# Patient Record
Sex: Female | Born: 1990 | Race: Black or African American | Hispanic: No | Marital: Married | Smoking: Never smoker
Health system: Southern US, Community
[De-identification: ages and names within clinical notes are randomized; demographics above are authoritative.]

## PROBLEM LIST (undated history)

## (undated) DIAGNOSIS — Z8619 Personal history of other infectious and parasitic diseases: Secondary | ICD-10-CM

## (undated) DIAGNOSIS — O24419 Gestational diabetes mellitus in pregnancy, unspecified control: Secondary | ICD-10-CM

## (undated) DIAGNOSIS — R87629 Unspecified abnormal cytological findings in specimens from vagina: Secondary | ICD-10-CM

## (undated) DIAGNOSIS — Z789 Other specified health status: Secondary | ICD-10-CM

## (undated) DIAGNOSIS — Z8759 Personal history of other complications of pregnancy, childbirth and the puerperium: Secondary | ICD-10-CM

## (undated) HISTORY — DX: Personal history of other infectious and parasitic diseases: Z86.19

## (undated) HISTORY — DX: Personal history of other complications of pregnancy, childbirth and the puerperium: Z87.59

## (undated) HISTORY — DX: Gestational diabetes mellitus in pregnancy, unspecified control: O24.419

## (undated) HISTORY — DX: Unspecified abnormal cytological findings in specimens from vagina: R87.629

---

## 2009-01-16 HISTORY — PX: VAGINA SURGERY: SHX829

## 2014-11-16 LAB — OB RESULTS CONSOLE GC/CHLAMYDIA
Chlamydia: NEGATIVE
Gonorrhea: NEGATIVE

## 2015-01-17 NOTE — L&D Delivery Note (Signed)
SVD of VMI at 2249 on 07/02/15.  EBL 250cc.  Placenta to pathology.  APGARs 8,9. Head delivered ROA with loose nuchal x 1 reduced.  Body delivered atraumatically.  Baby to abdomen.  Cord was clamped and cut.  Placenta delivered S/I/3VC.  Fundus was firmed with pitocin and massage.  Periclitoral laceration was repaired with 3-0 Rapide in the normal fashion.  Mom and baby stable.  Linda Hedges, DO

## 2015-01-19 LAB — OB RESULTS CONSOLE RPR
RPR: NONREACTIVE
RPR: NONREACTIVE

## 2015-01-19 LAB — OB RESULTS CONSOLE RUBELLA ANTIBODY, IGM: Rubella: NON-IMMUNE/NOT IMMUNE

## 2015-01-19 LAB — OB RESULTS CONSOLE ANTIBODY SCREEN: ANTIBODY SCREEN: NEGATIVE

## 2015-01-19 LAB — OB RESULTS CONSOLE ABO/RH: RH Type: POSITIVE

## 2015-01-19 LAB — OB RESULTS CONSOLE HIV ANTIBODY (ROUTINE TESTING): HIV: NONREACTIVE

## 2015-01-19 LAB — OB RESULTS CONSOLE HEPATITIS B SURFACE ANTIGEN: HEP B S AG: NEGATIVE

## 2015-02-15 ENCOUNTER — Other Ambulatory Visit (HOSPITAL_COMMUNITY): Payer: Self-pay | Admitting: Obstetrics & Gynecology

## 2015-02-15 DIAGNOSIS — R772 Abnormality of alphafetoprotein: Secondary | ICD-10-CM

## 2015-02-18 ENCOUNTER — Encounter (HOSPITAL_COMMUNITY): Payer: Self-pay

## 2015-02-18 ENCOUNTER — Other Ambulatory Visit (HOSPITAL_COMMUNITY): Payer: Self-pay | Admitting: Obstetrics & Gynecology

## 2015-02-18 ENCOUNTER — Ambulatory Visit (HOSPITAL_COMMUNITY)
Admission: RE | Admit: 2015-02-18 | Discharge: 2015-02-18 | Disposition: A | Discharge status: Home / Self Care | Payer: BLUE CROSS/BLUE SHIELD | Source: Ambulatory Visit | Attending: Obstetrics & Gynecology | Admitting: Obstetrics & Gynecology

## 2015-02-18 DIAGNOSIS — D582 Other hemoglobinopathies: Secondary | ICD-10-CM | POA: Insufficient documentation

## 2015-02-18 DIAGNOSIS — O281 Abnormal biochemical finding on antenatal screening of mother: Secondary | ICD-10-CM | POA: Diagnosis not present

## 2015-02-18 DIAGNOSIS — D259 Leiomyoma of uterus, unspecified: Secondary | ICD-10-CM

## 2015-02-18 DIAGNOSIS — R772 Abnormality of alphafetoprotein: Secondary | ICD-10-CM

## 2015-02-18 DIAGNOSIS — O280 Abnormal hematological finding on antenatal screening of mother: Secondary | ICD-10-CM | POA: Insufficient documentation

## 2015-02-18 DIAGNOSIS — O2412 Pre-existing diabetes mellitus, type 2, in childbirth: Secondary | ICD-10-CM | POA: Insufficient documentation

## 2015-02-18 DIAGNOSIS — Z3A20 20 weeks gestation of pregnancy: Secondary | ICD-10-CM | POA: Diagnosis not present

## 2015-02-18 DIAGNOSIS — Z862 Personal history of diseases of the blood and blood-forming organs and certain disorders involving the immune mechanism: Secondary | ICD-10-CM

## 2015-02-18 DIAGNOSIS — Z315 Encounter for genetic counseling: Secondary | ICD-10-CM | POA: Diagnosis not present

## 2015-02-18 DIAGNOSIS — Z3689 Encounter for other specified antenatal screening: Secondary | ICD-10-CM

## 2015-02-18 DIAGNOSIS — Z36 Encounter for antenatal screening of mother: Secondary | ICD-10-CM | POA: Diagnosis not present

## 2015-02-18 DIAGNOSIS — O3412 Maternal care for benign tumor of corpus uteri, second trimester: Secondary | ICD-10-CM

## 2015-02-18 NOTE — Progress Notes (Signed)
Genetic Counseling  High-Risk Gestation Note  Appointment Date:  02/18/2015 Referred By: Linda Hedges, DO Date of Birth:  10-03-1990 Partner:  Enid Cutter   Pregnancy History: G1P0000 Estimated Date of Delivery: 07/06/15 Estimated Gestational Age: [redacted]w[redacted]d Attending: Benjaman Lobe, MD    Teresa Myers and her partner, Mr. Damika Guthmiller, were seen for consultation for genetic counseling because of an elevated MSAFP of 5.24 MoMs based on maternal serum screening through [facility] [place] [facility] laboratory.   In Summary:   Elevated MSAFP 5.24 MoM  Discussed differential diagnoses including normal variation, ONTD, ventral wall defect, bleeding, kidney differences, amniotic fluid differences  Detailed ultrasound performed today and within normal limits  Declined amniocentesis  Discussed association with elevated AFP and placental problems; Follow-up ultrasound scheduled for 04/01/15 to assess fetal growth   Patient has hemoglobin C trait  Reviewed autosomal recessive inheritance and reduced carrier frequency of hemoglobin variants in individuals with European ancestry  Father of the pregnancy declined hemoglobin electrophoresis today  Couple expressed they are comfortable with newborn screening for hemoglobin variants  We reviewed Teresa Myers' maternal serum screening result, the elevation of MSAFP, and the associated > 1 in 5 risk for a fetal open neural tube defect.   We reviewed ONTDs, the typical multifactorial etiology, and variable prognosis.  In addition, we discussed additional explanations for an elevated MSAFP including: normal variation, twins, feto-maternal bleeding, a gestational dating error, abdominal wall defects, kidney differences, oligohydramnios, and placental problems.  We discussed that an unexplained elevation of MSAFP is associated with an increased risk for third trimester complications including: prematurity, low birth weight, and pre-eclampsia.    We reviewed additional  available screening and diagnostic options including detailed ultrasound and amniocentesis. Detailed ultrasound was performed today and was within normal limits. Complete ultrasound results reported separately. We discussed the risks, limitations, and benefits of ultrasound and amniocentesis.  After thoughtful consideration of these options, Teresa Myers declined amniocentesis.  She understands that ultrasound cannot rule out all birth defects or genetic syndromes.  However, she was counseled that 80-90% of fetuses with ONTDs can be detected by detailed 2nd trimester ultrasound, when well visualized.    The patient had routine screening through her OB office, which identified her to have hemoglobin C trait (Hb AC, or Hb C trait). Hemoglobin is the oxygen-carrying pigment of red blood cells.  The type of hemoglobin we have is determined by inheritance.  Most people typically inherit hemoglobin A.  Other variant types of hemoglobin include hemoglobin C or hemoglobin S.  If an individual inherits hemoglobin A from one parent and hemoglobin C from the other parent, that individual has hemoglobin C trait (noted as hemoglobin AC).  Likewise, an individual who inherits hemoglobin A from one parent and hemoglobin S from the other parent has hemoglobin S trait (sickle cell trait) (hemoglobin AS).  Hemoglobin C trait and sickle cell trait are not associated with any illness and there are generally no symptoms.  We reviewed genes and chromosomes. This couple was counseled that sickle cell anemia is inherited in an autosomal recessive manner, and occurs when both copies of the hemoglobin gene are changed and produce an abnormal hemoglobin. Typically, one abnormal gene for the production of hemoglobin S is inherited from each parent. Both the mother and the father of the baby must carry a variant hemoglobin such as hemoglobin S or C in order for a pregnancy to be at increased risk for a disease such as sickle cell  disease (hemoglobin SS)  or hemoglobin C disease (hemoglobin CC) in this pregnancy. Individuals with sickle cell disease (sickle cell anemia) have red blood cells that are not able to carry enough oxygen to all the tissues of the body, which may result in sickle cell crises, delays in growth and development, repeated infections, or hospitalization for blood transfusions and pain management. Since Ms. Jeneen Rinks does not carry hemoglobin S, this and future pregnancies are not expected to be at risk to inherit sickle cell disease (hemoglobin SS). Individuals with hemoglobin [place] disease are typically described to have milder features than individuals with hemoglobin SS. Hemoglobin C disease is generally a benign condition, although it can be associated with mild hemolytic anemia and/or splenomegaly (enlarged spleen).  If an enlarged spleen is painful, sometimes a splenectomy (surgical removal of the spleen) is performed.  There appear to be no other symptoms associated with this hemoglobin C disease.   Given the recessive inheritance, we discussed the importance of understanding the father of the pregnancy's carrier status in order to accurately predict the risk of a hemoglobinopathy in the fetus. He reported Northern European ancestry, no known family history of sickle cell anemia or hemoglobinopathies, and reported no known consanguinity to the patient.  We discussed that the carrier frequency for sickle cell trait is lower in the Caucasian population. When both parents are identified carriers, prenatal diagnosis via amniocentesis would be available. We also reviewed the availability of newborn screening in [place] for hemoglobinopathies.  The father of the pregnancy was offered the option of testing via hemoglobin electrophoresis to determine whether he has any hemoglobin variant, including sickle cell trait. He declined this testing today. They understand that an accurate risk assessment cannot be performed  without further information.   Both family histories were reviewed and found to be otherwise contributory for multiple relatives with vision loss for the patient. She reported that her maternal grandmother has blindness with onset estimated to be in her 52's. The patient had limited information regarding the age of onset. She reported that her mother and three maternal aunts have similar features with developing cataracts and having vision issues. Her mother is currently in her 30's. We discussed that cataracts and/or vision loss can be due to various causes and are often multifactorial. However, there are identified genetic causes for vision loss that can follow various forms of inheritance including autosomal dominant, autosomal recessive, and X-linked. The patient's reported family history is possibly suggestive of an autosomal dominant form of vision loss. We reviewed autosomal dominant inheritance and recurrence risk for offspring of affected relatives. We discussed that additional information regarding this family history is needed to accurately assess recurrence risk and possible implications for relatives. We discussed the importance of Ms. Jeneen Rinks informing her physician of this history so she can be followed and screened appropriately. Without further information regarding the provided family history, an accurate genetic risk cannot be calculated. Further genetic counseling is warranted if more information is obtained.  Ms. Sahra Bolan denied exposure to environmental toxins or chemical agents. She denied the use of alcohol, tobacco or street drugs. She denied significant viral illnesses during the course of her pregnancy. Her medical and surgical histories were noncontributory.   I counseled this couple for approximately 35 minutes regarding the above risks and available options.    Chipper Oman, MS,  Certified Genetic Counselor 02/18/2015

## 2015-02-26 ENCOUNTER — Encounter (HOSPITAL_COMMUNITY): Payer: Self-pay

## 2015-04-01 ENCOUNTER — Other Ambulatory Visit (HOSPITAL_COMMUNITY): Payer: Self-pay | Admitting: Maternal and Fetal Medicine

## 2015-04-01 ENCOUNTER — Ambulatory Visit (HOSPITAL_COMMUNITY)
Admission: RE | Admit: 2015-04-01 | Discharge: 2015-04-01 | Disposition: A | Discharge status: Home / Self Care | Payer: BLUE CROSS/BLUE SHIELD | Source: Ambulatory Visit | Attending: Obstetrics & Gynecology | Admitting: Obstetrics & Gynecology

## 2015-04-01 VITALS — BP 124/72 | HR 107 | Wt 132.2 lb

## 2015-04-01 DIAGNOSIS — Z3A26 26 weeks gestation of pregnancy: Secondary | ICD-10-CM | POA: Diagnosis not present

## 2015-04-01 DIAGNOSIS — O281 Abnormal biochemical finding on antenatal screening of mother: Secondary | ICD-10-CM

## 2015-04-01 DIAGNOSIS — O289 Unspecified abnormal findings on antenatal screening of mother: Secondary | ICD-10-CM | POA: Insufficient documentation

## 2015-04-01 DIAGNOSIS — O3412 Maternal care for benign tumor of corpus uteri, second trimester: Secondary | ICD-10-CM

## 2015-04-01 DIAGNOSIS — O280 Abnormal hematological finding on antenatal screening of mother: Secondary | ICD-10-CM

## 2015-04-01 DIAGNOSIS — D259 Leiomyoma of uterus, unspecified: Secondary | ICD-10-CM

## 2015-04-01 DIAGNOSIS — Z862 Personal history of diseases of the blood and blood-forming organs and certain disorders involving the immune mechanism: Secondary | ICD-10-CM

## 2015-04-01 DIAGNOSIS — D582 Other hemoglobinopathies: Secondary | ICD-10-CM

## 2015-05-28 ENCOUNTER — Ambulatory Visit (HOSPITAL_COMMUNITY): Admission: RE | Admit: 2015-05-28 | Payer: BLUE CROSS/BLUE SHIELD | Source: Ambulatory Visit

## 2015-06-04 LAB — OB RESULTS CONSOLE GBS: GBS: POSITIVE

## 2015-06-30 ENCOUNTER — Encounter (HOSPITAL_COMMUNITY): Payer: Self-pay

## 2015-06-30 ENCOUNTER — Inpatient Hospital Stay (HOSPITAL_COMMUNITY)
Admission: AD | Admit: 2015-06-30 | Discharge: 2015-07-04 | DRG: 775 | Disposition: A | Discharge status: Home / Self Care | Payer: Self-pay | Source: Ambulatory Visit | Attending: Obstetrics & Gynecology | Admitting: Obstetrics & Gynecology

## 2015-06-30 DIAGNOSIS — O139 Gestational [pregnancy-induced] hypertension without significant proteinuria, unspecified trimester: Secondary | ICD-10-CM | POA: Diagnosis present

## 2015-06-30 DIAGNOSIS — O99824 Streptococcus B carrier state complicating childbirth: Secondary | ICD-10-CM | POA: Diagnosis present

## 2015-06-30 DIAGNOSIS — O133 Gestational [pregnancy-induced] hypertension without significant proteinuria, third trimester: Secondary | ICD-10-CM

## 2015-06-30 DIAGNOSIS — O411230 Chorioamnionitis, third trimester, not applicable or unspecified: Secondary | ICD-10-CM | POA: Diagnosis present

## 2015-06-30 DIAGNOSIS — R1900 Intra-abdominal and pelvic swelling, mass and lump, unspecified site: Secondary | ICD-10-CM

## 2015-06-30 DIAGNOSIS — Z3A39 39 weeks gestation of pregnancy: Secondary | ICD-10-CM

## 2015-06-30 DIAGNOSIS — O134 Gestational [pregnancy-induced] hypertension without significant proteinuria, complicating childbirth: Principal | ICD-10-CM | POA: Diagnosis present

## 2015-06-30 HISTORY — DX: Other specified health status: Z78.9

## 2015-06-30 LAB — URINE MICROSCOPIC-ADD ON

## 2015-06-30 LAB — COMPREHENSIVE METABOLIC PANEL
ALT: 16 U/L (ref 14–54)
ALT: 15 U/L (ref 14–54)
ANION GAP: 9 (ref 5–15)
AST: 29 U/L (ref 15–41)
AST: 29 U/L (ref 15–41)
Albumin: 3 g/dL — ABNORMAL LOW (ref 3.5–5.0)
Albumin: 3 g/dL — ABNORMAL LOW (ref 3.5–5.0)
Alkaline Phosphatase: 231 U/L — ABNORMAL HIGH (ref 38–126)
Alkaline Phosphatase: 218 U/L — ABNORMAL HIGH (ref 38–126)
Anion gap: 7 (ref 5–15)
BILIRUBIN TOTAL: 0.5 mg/dL (ref 0.3–1.2)
BUN: 10 mg/dL (ref 6–20)
BUN: 9 mg/dL (ref 6–20)
CHLORIDE: 107 mmol/L (ref 101–111)
CO2: 20 mmol/L — ABNORMAL LOW (ref 22–32)
CO2: 21 mmol/L — ABNORMAL LOW (ref 22–32)
CREATININE: 0.75 mg/dL (ref 0.44–1.00)
Calcium: 10 mg/dL (ref 8.9–10.3)
Calcium: 9.7 mg/dL (ref 8.9–10.3)
Chloride: 106 mmol/L (ref 101–111)
Creatinine, Ser: 0.71 mg/dL (ref 0.44–1.00)
GFR calc Af Amer: >60 mL/min (ref >60)
GFR calc non Af Amer: >60 mL/min (ref >60)
GFR calc non Af Amer: >60 mL/min (ref >60)
Glucose, Bld: 89 mg/dL (ref 65–99)
Glucose, Bld: 91 mg/dL (ref 65–99)
Potassium: 3.9 mmol/L (ref 3.5–5.1)
Potassium: 3.6 mmol/L (ref 3.5–5.1)
SODIUM: 135 mmol/L (ref 135–145)
Sodium: 135 mmol/L (ref 135–145)
TOTAL PROTEIN: 7.1 g/dL (ref 6.5–8.1)
Total Bilirubin: 0.6 mg/dL (ref 0.3–1.2)
Total Protein: 6.9 g/dL (ref 6.5–8.1)

## 2015-06-30 LAB — URINALYSIS, ROUTINE W REFLEX MICROSCOPIC
Bilirubin Urine: NEGATIVE
Glucose, UA: NEGATIVE mg/dL
Ketones, ur: NEGATIVE mg/dL
Nitrite: NEGATIVE
PROTEIN: 100 mg/dL — AB
Specific Gravity, Urine: 1.025 (ref 1.005–1.030)
pH: 6 (ref 5.0–8.0)

## 2015-06-30 LAB — CBC
HCT: 30.6 % — ABNORMAL LOW (ref 36.0–46.0)
HEMATOCRIT: 31.9 % — AB (ref 36.0–46.0)
Hemoglobin: 11.2 g/dL — ABNORMAL LOW (ref 12.0–15.0)
Hemoglobin: 10.6 g/dL — ABNORMAL LOW (ref 12.0–15.0)
MCH: 29.2 pg (ref 26.0–34.0)
MCH: 28.8 pg (ref 26.0–34.0)
MCHC: 35.1 g/dL (ref 30.0–36.0)
MCHC: 34.6 g/dL (ref 30.0–36.0)
MCV: 83.1 fL (ref 78.0–100.0)
MCV: 83.2 fL (ref 78.0–100.0)
Platelets: 256 10*3/uL (ref 150–400)
Platelets: 247 10*3/uL (ref 150–400)
RBC: 3.84 MIL/uL — ABNORMAL LOW (ref 3.87–5.11)
RBC: 3.68 MIL/uL — ABNORMAL LOW (ref 3.87–5.11)
RDW: 14.5 % (ref 11.5–15.5)
RDW: 14.4 % (ref 11.5–15.5)
WBC: 7.4 10*3/uL (ref 4.0–10.5)
WBC: 8 10*3/uL (ref 4.0–10.5)

## 2015-06-30 LAB — TYPE AND SCREEN
ABO/RH(D): O POS
ANTIBODY SCREEN: NEGATIVE

## 2015-06-30 LAB — ABO/RH: ABO/RH(D): O POS

## 2015-06-30 MED ORDER — ONDANSETRON HCL 4 MG/2ML IJ SOLN: 4.0000 mg | Freq: Four times a day (QID) | INTRAMUSCULAR | Status: DC | PRN

## 2015-06-30 MED ORDER — HYDRALAZINE HCL 20 MG/ML IJ SOLN: 10.0000 mg | Freq: Once | INTRAMUSCULAR | Status: DC | PRN

## 2015-06-30 MED ORDER — OXYCODONE-ACETAMINOPHEN 5-325 MG PO TABS: 1.0000 | ORAL_TABLET | ORAL | Status: DC | PRN

## 2015-06-30 MED ORDER — LACTATED RINGERS IV SOLN: 500.0000 mL | INTRAVENOUS | Status: DC | PRN

## 2015-06-30 MED ORDER — OXYTOCIN 40 UNITS IN LACTATED RINGERS INFUSION - SIMPLE MED
2.5000 [IU]/h | INTRAVENOUS | Status: DC
  Filled 2015-06-30: qty 1000

## 2015-06-30 MED ORDER — ACETAMINOPHEN 325 MG PO TABS
650.0000 mg | ORAL_TABLET | ORAL | Status: DC | PRN
  Administered 2015-07-02: 650 mg via ORAL
  Filled 2015-06-30 (×2): qty 2

## 2015-06-30 MED ORDER — MISOPROSTOL 25 MCG QUARTER TABLET
25.0000 ug | ORAL_TABLET | ORAL | Status: DC | PRN
  Administered 2015-06-30 – 2015-07-01 (×4): 25 ug via VAGINAL
  Filled 2015-06-30 (×4): qty 0.25

## 2015-06-30 MED ORDER — PENICILLIN G POTASSIUM 5000000 UNITS IJ SOLR
2.5000 10*6.[IU] | INTRAMUSCULAR | Status: DC
  Administered 2015-06-30 – 2015-07-01 (×6): 2.5 10*6.[IU] via INTRAVENOUS
  Filled 2015-06-30 (×9): qty 2.5

## 2015-06-30 MED ORDER — PENICILLIN G POTASSIUM 5000000 UNITS IJ SOLR
5.0000 10*6.[IU] | Freq: Once | INTRAMUSCULAR | Status: AC
  Administered 2015-06-30: 5 10*6.[IU] via INTRAVENOUS
  Filled 2015-06-30: qty 5

## 2015-06-30 MED ORDER — LIDOCAINE HCL (PF) 1 % IJ SOLN
30.0000 mL | INTRAMUSCULAR | Status: DC | PRN
  Filled 2015-06-30: qty 30

## 2015-06-30 MED ORDER — ZOLPIDEM TARTRATE 5 MG PO TABS: 5.0000 mg | ORAL_TABLET | Freq: Every evening | ORAL | Status: DC | PRN

## 2015-06-30 MED ORDER — OXYTOCIN BOLUS FROM INFUSION: 500.0000 mL | INTRAVENOUS | Status: DC

## 2015-06-30 MED ORDER — FLEET ENEMA 7-19 GM/118ML RE ENEM
1.0000 | ENEMA | RECTAL | Status: DC | PRN
Start: 2015-06-30 — End: 2015-07-03

## 2015-06-30 MED ORDER — LACTATED RINGERS IV SOLN
INTRAVENOUS | Status: DC
  Administered 2015-06-30 – 2015-07-02 (×7): via INTRAVENOUS

## 2015-06-30 MED ORDER — BUTORPHANOL TARTRATE 1 MG/ML IJ SOLN
1.0000 mg | INTRAMUSCULAR | Status: DC | PRN
  Administered 2015-06-30: 1 mg via INTRAVENOUS
  Filled 2015-06-30: qty 1

## 2015-06-30 MED ORDER — DIPHENHYDRAMINE HCL 25 MG PO CAPS
25.0000 mg | ORAL_CAPSULE | Freq: Four times a day (QID) | ORAL | Status: DC | PRN
  Administered 2015-06-30: 25 mg via ORAL
  Filled 2015-06-30: qty 1

## 2015-06-30 MED ORDER — TERBUTALINE SULFATE 1 MG/ML IJ SOLN
0.2500 mg | Freq: Once | INTRAMUSCULAR | Status: DC | PRN
  Filled 2015-06-30: qty 1

## 2015-06-30 MED ORDER — LABETALOL HCL 5 MG/ML IV SOLN
20.0000 mg | INTRAVENOUS | Status: DC | PRN
Start: 2015-06-30 — End: 2015-07-03

## 2015-06-30 MED ORDER — NALBUPHINE HCL 10 MG/ML IJ SOLN
10.0000 mg | Freq: Once | INTRAMUSCULAR | Status: AC
  Administered 2015-06-30: 10 mg via INTRAVENOUS
  Filled 2015-06-30: qty 1

## 2015-06-30 MED ORDER — SOD CITRATE-CITRIC ACID 500-334 MG/5ML PO SOLN: 30.0000 mL | ORAL | Status: DC | PRN

## 2015-06-30 MED ORDER — OXYCODONE-ACETAMINOPHEN 5-325 MG PO TABS: 2.0000 | ORAL_TABLET | ORAL | Status: DC | PRN

## 2015-06-30 NOTE — H&P (Signed)
Teresa Myers is a 25 y.o. female presenting for IOL for gestational hypertension. No HA, vision change, epigastric pain. Maternal Medical History:  Fetal activity: Perceived fetal activity is normal.      OB History    Gravida Para Term Preterm AB TAB SAB Ectopic Multiple Living   1 0 0 0 0 0 0 0 0 0      Past Medical History  Diagnosis Date  . Medical history non-contributory    Past Surgical History  Procedure Laterality Date  . Vagina surgery  2011    laser surgery   Family History: family history is negative for Cancer, Diabetes, and Hypertension. Social History:  reports that she has never smoked. She has never used smokeless tobacco. She reports that she does not drink alcohol or use illicit drugs.   Prenatal Transfer Tool  Maternal Diabetes: No Genetic Screening: Normal Maternal Ultrasounds/Referrals: Normal Fetal Ultrasounds or other Referrals:  None Maternal Substance Abuse:  No Significant Maternal Medications:  None Significant Maternal Lab Results:  None Other Comments:  None  Review of Systems  Eyes: Negative for blurred vision.  Gastrointestinal: Negative for abdominal pain.  Neurological: Negative for headaches.    Dilation: Closed Effacement (%): Thick Exam by:: Lurena Nida, RN Blood pressure 139/97, pulse 97, temperature 98.5 F (36.9 C), temperature source Oral, resp. rate 16, height 5\' 4"  (1.626 m), weight 160 lb (72.576 kg), last menstrual period 09/29/2014, SpO2 100 %. Maternal Exam:  Uterine Assessment: Contraction strength is mild.  Contraction frequency is irregular.   Abdomen: Patient reports no abdominal tenderness. Fetal presentation: vertex     Fetal Exam Fetal State Assessment: Category I - tracings are normal.     Physical Exam  Cardiovascular: Normal rate and regular rhythm.   Respiratory: Effort normal and breath sounds normal.  GI: Soft. There is no tenderness.  Neurological: She has normal reflexes.    Prenatal  labs: ABO, Rh: --/--/O POS (06/14 1127) Antibody: NEG (06/14 1127) Rubella: Nonimmune (01/03 0000) RPR: Nonreactive, Nonreactive (01/03 0000)  HBsAg: Negative (01/03 0000)  HIV: Non-reactive (01/03 0000)  GBS: Positive (05/19 0000)   Results for orders placed or performed during the hospital encounter of 06/30/15 (from the past 24 hour(s))  Urinalysis, Routine w reflex microscopic (not at [facility])     Status: Abnormal   Collection Time: 06/30/15 11:20 AM  Result Value Ref Range   Color, Urine YELLOW YELLOW   APPearance HAZY (A) CLEAR   Specific Gravity, Urine 1.025 1.005 - 1.030   pH 6.0 5.0 - 8.0   Glucose, UA NEGATIVE NEGATIVE mg/dL   Hgb urine dipstick TRACE (A) NEGATIVE   Bilirubin Urine NEGATIVE NEGATIVE   Ketones, ur NEGATIVE NEGATIVE mg/dL   Protein, ur 100 (A) NEGATIVE mg/dL   Nitrite NEGATIVE NEGATIVE   Leukocytes, UA TRACE (A) NEGATIVE  Urine microscopic-add on     Status: Abnormal   Collection Time: 06/30/15 11:20 AM  Result Value Ref Range   Squamous Epithelial / LPF 6-30 (A) NONE SEEN   WBC, UA 6-30 0 - 5 WBC/hpf   RBC / HPF 0-5 0 - 5 RBC/hpf   Bacteria, UA MANY (A) NONE SEEN  CBC     Status: Abnormal   Collection Time: 06/30/15 11:27 AM  Result Value Ref Range   WBC 7.4 4.0 - 10.5 K/uL   RBC 3.84 (L) 3.87 - 5.11 MIL/uL   Hemoglobin 11.2 (L) 12.0 - 15.0 g/dL   HCT 31.9 (L) 36.0 - 46.0 %  MCV 83.1 78.0 - 100.0 fL   MCH 29.2 26.0 - 34.0 pg   MCHC 35.1 30.0 - 36.0 g/dL   RDW 14.5 11.5 - 15.5 %   Platelets 256 150 - 400 K/uL  Comprehensive metabolic panel     Status: Abnormal   Collection Time: 06/30/15 11:27 AM  Result Value Ref Range   Sodium 135 135 - 145 mmol/L   Potassium 3.9 3.5 - 5.1 mmol/L   Chloride 106 101 - 111 mmol/L   CO2 20 (L) 22 - 32 mmol/L   Glucose, Bld 89 65 - 99 mg/dL   BUN 10 6 - 20 mg/dL   Creatinine, Ser 0.75 0.44 - 1.00 mg/dL   Calcium 10.0 8.9 - 10.3 mg/dL   Total Protein 7.1 6.5 - 8.1 g/dL   Albumin 3.0 (L) 3.5 - 5.0 g/dL   AST  29 15 - 41 U/L   ALT 16 14 - 54 U/L   Alkaline Phosphatase 231 (H) 38 - 126 U/L   Total Bilirubin 0.5 0.3 - 1.2 mg/dL   GFR calc non Af Amer >60 >60 mL/min   GFR calc Af Amer >60 >60 mL/min   Anion gap 9 5 - 15  Type and screen [place]     Status: None   Collection Time: 06/30/15 11:27 AM  Result Value Ref Range   ABO/RH(D) O POS    Antibody Screen NEG    Sample Expiration 07/03/2015   CBC     Status: Abnormal   Collection Time: 06/30/15  4:59 PM  Result Value Ref Range   WBC 8.0 4.0 - 10.5 K/uL   RBC 3.68 (L) 3.87 - 5.11 MIL/uL   Hemoglobin 10.6 (L) 12.0 - 15.0 g/dL   HCT 30.6 (L) 36.0 - 46.0 %   MCV 83.2 78.0 - 100.0 fL   MCH 28.8 26.0 - 34.0 pg   MCHC 34.6 30.0 - 36.0 g/dL   RDW 14.4 11.5 - 15.5 %   Platelets 247 150 - 400 K/uL   Assessment/Plan: 25 yo G1P0 @ 60 1/7 weeks with gestational hypertension D/W patient and husband two stage induction of labor, risks reviewed All questions answered Patient states she understands and agrees    Adeeb Konecny II,Tarika Mckethan E 06/30/2015, 5:09 PM

## 2015-06-30 NOTE — Anesthesia Pain Management Evaluation Note (Signed)
  CRNA Pain Management Visit Note  Patient: Teresa Myers, 25 y.o., female  "Hello I am a member of the anesthesia team at [facility]. We have an anesthesia team available at all times to provide care throughout the hospital, including epidural management and anesthesia for C-section. I don't know your plan for the delivery whether it a natural birth, water birth, IV sedation, nitrous supplementation, doula or epidural, but we want to meet your pain goals."   1.Was your pain managed to your expectations on prior hospitalizations? Never hospitalized before this pregnancy.  No prior hospitalizations  2.What is your expectation for pain management during this hospitalization?     Epidural, IV pain meds and Nitrous Oxide  3.How can we help you reach that goal? By explaining my options for pain control.  Record the patient's initial score and the patient's pain goal.   Pain: 0  Pain Goal: 7 The [facility] wants you to be able to say your pain was always managed very well.  Jabier Mutton 06/30/2015

## 2015-06-30 NOTE — MAU Note (Signed)
Pt sent over from Dr Tomblin's office due to increased blood pressure noted by MD at routine office visit today. Pt for pre-eclampsia evaluation. Pt denies bleeding and leaking of fluid. Pt states baby is moving normally.

## 2015-06-30 NOTE — MAU Provider Note (Signed)
History     CSN: [id number]  Arrival date and time: 06/30/15 1115   First Provider Initiated Contact with Patient 06/30/15 1222        Chief Complaint  Patient presents with  . Hypertension   HPI Teresa Myers is a 25 y.o. G1P0000 at [redacted]w[redacted]d who was sent from office for BP evaluation. Pt seen in office today for ROV & found to have elevated BP & proteinuria. Patient denies history of high blood pressure. Denies headache, vision changes, epigastric pain, chest pain, SOB. Denies OB complaints. Positive fetal movement.   OB History    Gravida Para Term Preterm AB TAB SAB Ectopic Multiple Living   1 0 0 0 0 0 0 0 0 0       Past Medical History  Diagnosis Date  . Medical history non-contributory     Past Surgical History  Procedure Laterality Date  . Vagina surgery  2011    laser surgery    Family History  Problem Relation Age of Onset  . Cancer Neg Hx   . Diabetes Neg Hx   . Hypertension Neg Hx     Social History  Substance Use Topics  . Smoking status: Never Smoker   . Smokeless tobacco: Never Used  . Alcohol Use: No    Allergies:  Allergies  Allergen Reactions  . Other Dermatitis    Dairy causes eczema    Prescriptions prior to admission  Medication Sig Dispense Refill Last Dose  . Prenatal Vit-Fe Fumarate-FA (PRENATAL MULTIVITAMIN) TABS tablet Take 1 tablet by mouth at bedtime.    06/29/2015 at Unknown time    Review of Systems  Constitutional: Negative.   Eyes: Negative for blurred vision and double vision.  Respiratory: Negative.   Cardiovascular: Negative.   Gastrointestinal: Negative.   Genitourinary: Negative.   Neurological: Negative for headaches.   Physical Exam   Blood pressure 139/96, pulse 98, temperature 98.5 F (36.9 C), temperature source Oral, resp. rate 17, height 5\' 4"  (1.626 m), weight 160 lb (72.576 kg), last menstrual period 09/29/2014, SpO2 100 %.  Patient Vitals for the past 24 hrs:  BP Temp Temp src Pulse Resp SpO2 Height  Weight  06/30/15 1214 141/96 mmHg - - 90 - - - -  06/30/15 1204 139/96 mmHg - - 98 - - - -  06/30/15 1200 141/96 mmHg - - 89 - - - -  06/30/15 1159 (!) 147/125 mmHg - - 96 - - - -  06/30/15 1144 144/98 mmHg - - 92 - - - -  06/30/15 1140 139/98 mmHg 98.5 F (36.9 C) Oral 91 17 - 5\' 4"  (1.626 m) 160 lb (72.576 kg)  06/30/15 1138 153/99 mmHg - - 90 - - - -  06/30/15 1137 - - - 93 - 100 % - -    Physical Exam  Nursing note and vitals reviewed. Constitutional: She is oriented to person, place, and time. She appears well-developed and well-nourished. No distress.  HENT:  Head: Normocephalic and atraumatic.  Eyes: Conjunctivae are normal. Right eye exhibits no discharge. Left eye exhibits no discharge. No scleral icterus.  Neck: Normal range of motion.  Cardiovascular: Normal rate, regular rhythm and normal heart sounds.   No murmur heard. Respiratory: Effort normal and breath sounds normal. No respiratory distress. She has no wheezes.  GI: Soft. There is no tenderness.  Musculoskeletal: She exhibits no edema.  Neurological: She is alert and oriented to person, place, and time. She has normal reflexes.  No clonus  Skin: Skin is warm and dry. She is not diaphoretic.  Psychiatric: She has a normal mood and affect. Her behavior is normal. Judgment and thought content normal.   Fetal Tracing:  Baseline: 140 Variability: moderate Accelerations: 15x15 Decelerations: none  Toco: 2-6    MAU Course  Procedures Results for orders placed or performed during the hospital encounter of 06/30/15 (from the past 24 hour(s))  Urinalysis, Routine w reflex microscopic (not at [facility])     Status: Abnormal   Collection Time: 06/30/15 11:20 AM  Result Value Ref Range   Color, Urine YELLOW YELLOW   APPearance HAZY (A) CLEAR   Specific Gravity, Urine 1.025 1.005 - 1.030   pH 6.0 5.0 - 8.0   Glucose, UA NEGATIVE NEGATIVE mg/dL   Hgb urine dipstick TRACE (A) NEGATIVE   Bilirubin Urine NEGATIVE  NEGATIVE   Ketones, ur NEGATIVE NEGATIVE mg/dL   Protein, ur 100 (A) NEGATIVE mg/dL   Nitrite NEGATIVE NEGATIVE   Leukocytes, UA TRACE (A) NEGATIVE  Urine microscopic-add on     Status: Abnormal   Collection Time: 06/30/15 11:20 AM  Result Value Ref Range   Squamous Epithelial / LPF 6-30 (A) NONE SEEN   WBC, UA 6-30 0 - 5 WBC/hpf   RBC / HPF 0-5 0 - 5 RBC/hpf   Bacteria, UA MANY (A) NONE SEEN  CBC     Status: Abnormal   Collection Time: 06/30/15 11:27 AM  Result Value Ref Range   WBC 7.4 4.0 - 10.5 K/uL   RBC 3.84 (L) 3.87 - 5.11 MIL/uL   Hemoglobin 11.2 (L) 12.0 - 15.0 g/dL   HCT 31.9 (L) 36.0 - 46.0 %   MCV 83.1 78.0 - 100.0 fL   MCH 29.2 26.0 - 34.0 pg   MCHC 35.1 30.0 - 36.0 g/dL   RDW 14.5 11.5 - 15.5 %   Platelets 256 150 - 400 K/uL  Comprehensive metabolic panel     Status: Abnormal   Collection Time: 06/30/15 11:27 AM  Result Value Ref Range   Sodium 135 135 - 145 mmol/L   Potassium 3.9 3.5 - 5.1 mmol/L   Chloride 106 101 - 111 mmol/L   CO2 20 (L) 22 - 32 mmol/L   Glucose, Bld 89 65 - 99 mg/dL   BUN 10 6 - 20 mg/dL   Creatinine, Ser 0.75 0.44 - 1.00 mg/dL   Calcium 10.0 8.9 - 10.3 mg/dL   Total Protein 7.1 6.5 - 8.1 g/dL   Albumin 3.0 (L) 3.5 - 5.0 g/dL   AST 29 15 - 41 U/L   ALT 16 14 - 54 U/L   Alkaline Phosphatase 231 (H) 38 - 126 U/L   Total Bilirubin 0.5 0.3 - 1.2 mg/dL   GFR calc non Af Amer >60 >60 mL/min   GFR calc Af Amer >60 >60 mL/min   Anion gap 9 5 - 15    MDM No severe range BPs (one BP registered with diastolic 0000000 was not accurate) Reactive tracing S/w Dr. Gaetano Net who will admit patient for gestational hypertension  Assessment and Plan  A: 1. Gestational hypertension, third trimester      Jorje Guild 06/30/2015, 12:21 PM

## 2015-07-01 LAB — RPR: RPR Ser Ql: NONREACTIVE

## 2015-07-01 MED ORDER — PENICILLIN G POTASSIUM 5000000 UNITS IJ SOLR
2.5000 10*6.[IU] | INTRAVENOUS | Status: DC
  Administered 2015-07-02 (×4): 2.5 10*6.[IU] via INTRAVENOUS
  Filled 2015-07-01 (×9): qty 2.5

## 2015-07-01 MED ORDER — NALBUPHINE HCL 10 MG/ML IJ SOLN
10.0000 mg | INTRAMUSCULAR | Status: DC | PRN
  Administered 2015-07-01 – 2015-07-02 (×6): 10 mg via INTRAVENOUS
  Filled 2015-07-01 (×6): qty 1

## 2015-07-01 MED ORDER — MISOPROSTOL 25 MCG QUARTER TABLET
25.0000 ug | ORAL_TABLET | ORAL | Status: DC
  Administered 2015-07-01 – 2015-07-02 (×2): 25 ug via VAGINAL
  Filled 2015-07-01 (×2): qty 1
  Filled 2015-07-01: qty 0.25
  Filled 2015-07-01: qty 1
  Filled 2015-07-01: qty 0.25

## 2015-07-01 MED ORDER — TERBUTALINE SULFATE 1 MG/ML IJ SOLN
0.2500 mg | Freq: Once | INTRAMUSCULAR | Status: DC | PRN
  Filled 2015-07-01: qty 1

## 2015-07-01 MED ORDER — OXYTOCIN 40 UNITS IN LACTATED RINGERS INFUSION - SIMPLE MED
1.0000 m[IU]/min | INTRAVENOUS | Status: DC
  Administered 2015-07-01: 2 m[IU]/min via INTRAVENOUS
  Filled 2015-07-01: qty 1000

## 2015-07-01 NOTE — Progress Notes (Signed)
Patient ID: Teresa Myers, female   DOB: [birth year], 25 y.o.   MRN: [id number] [name] denies any HA, scotomata or ruq pain Exhausted by long day of ctxs and minimal change Wants a break,before restarting IOL  VSSAF  BPs 125/68-157/95 FHR 140s Cat 1  Ctxs from q 2' to 5  Cx 0.5/posterior/thick  DTRs 1/4   Gestational HTN - stable Pt wants shower, dinner, and break before restarting Will d/c Pit, and restart with cytotec tonight.  Restart abx and pit in am and hopefully will get enough change for AROM

## 2015-07-01 NOTE — Progress Notes (Signed)
Patient ID: Teresa Myers, female   DOB: 09-09-1990, 25 y.o.   MRN: [id number] Pt uncomfortable with ctxs Better with pain meds No CNS sxs  VS BP 101/64 FHR 140s cat 1 ctxs irreg  Cx Closed/25/Posterior and firm  Gest HTN IOL - stable Unable to arom.  Will continue with cytotec then try pit AROM when able GBS + on IV Abx

## 2015-07-02 ENCOUNTER — Inpatient Hospital Stay (HOSPITAL_COMMUNITY): Payer: Self-pay | Admitting: Anesthesiology

## 2015-07-02 ENCOUNTER — Encounter (HOSPITAL_COMMUNITY): Payer: Self-pay | Admitting: Anesthesiology

## 2015-07-02 LAB — CBC
HCT: 29.4 % — ABNORMAL LOW (ref 36.0–46.0)
Hemoglobin: 10.2 g/dL — ABNORMAL LOW (ref 12.0–15.0)
MCH: 28.8 pg (ref 26.0–34.0)
MCHC: 34.7 g/dL (ref 30.0–36.0)
MCV: 83.1 fL (ref 78.0–100.0)
Platelets: 241 10*3/uL (ref 150–400)
RBC: 3.54 MIL/uL — ABNORMAL LOW (ref 3.87–5.11)
RDW: 14.5 % (ref 11.5–15.5)
WBC: 10.6 10*3/uL — AB (ref 4.0–10.5)

## 2015-07-02 MED ORDER — TERBUTALINE SULFATE 1 MG/ML IJ SOLN
0.2500 mg | Freq: Once | INTRAMUSCULAR | Status: DC | PRN
  Filled 2015-07-02: qty 1

## 2015-07-02 MED ORDER — PHENYLEPHRINE 40 MCG/ML (10ML) SYRINGE FOR IV PUSH (FOR BLOOD PRESSURE SUPPORT)
80.0000 ug | PREFILLED_SYRINGE | INTRAVENOUS | Status: DC | PRN
  Filled 2015-07-02: qty 10
  Filled 2015-07-02: qty 5

## 2015-07-02 MED ORDER — FENTANYL 2.5 MCG/ML BUPIVACAINE 1/10 % EPIDURAL INFUSION (WH - ANES)
14.0000 mL/h | INTRAMUSCULAR | Status: DC | PRN
  Administered 2015-07-02 (×2): 14 mL/h via EPIDURAL
  Filled 2015-07-02 (×2): qty 125

## 2015-07-02 MED ORDER — DEXTROSE 5 % IV SOLN
2.0000 mg/kg | Freq: Once | INTRAVENOUS | Status: AC
  Administered 2015-07-02: 150 mg via INTRAVENOUS
  Filled 2015-07-02: qty 3.75

## 2015-07-02 MED ORDER — SODIUM CHLORIDE 0.9 % IV SOLN
2.0000 g | Freq: Once | INTRAVENOUS | Status: AC
  Administered 2015-07-02: 2 g via INTRAVENOUS
  Filled 2015-07-02: qty 2000

## 2015-07-02 MED ORDER — OXYTOCIN 40 UNITS IN LACTATED RINGERS INFUSION - SIMPLE MED
1.0000 m[IU]/min | INTRAVENOUS | Status: DC
  Administered 2015-07-02: 2 m[IU]/min via INTRAVENOUS

## 2015-07-02 MED ORDER — PHENYLEPHRINE 40 MCG/ML (10ML) SYRINGE FOR IV PUSH (FOR BLOOD PRESSURE SUPPORT)
80.0000 ug | PREFILLED_SYRINGE | INTRAVENOUS | Status: DC | PRN
  Filled 2015-07-02: qty 5

## 2015-07-02 MED ORDER — DIPHENHYDRAMINE HCL 50 MG/ML IJ SOLN: 12.5000 mg | INTRAMUSCULAR | Status: DC | PRN

## 2015-07-02 MED ORDER — LACTATED RINGERS IV SOLN: 500.0000 mL | Freq: Once | INTRAVENOUS | Status: DC

## 2015-07-02 MED ORDER — EPHEDRINE 5 MG/ML INJ
10.0000 mg | INTRAVENOUS | Status: DC | PRN
  Filled 2015-07-02: qty 2

## 2015-07-02 MED ORDER — LIDOCAINE HCL (PF) 1 % IJ SOLN
INTRAMUSCULAR | Status: DC | PRN
  Administered 2015-07-02 (×2): 6 mL

## 2015-07-02 MED ORDER — LACTATED RINGERS IV SOLN
500.0000 mL | Freq: Once | INTRAVENOUS | Status: AC
  Administered 2015-07-02: 500 mL via INTRAVENOUS

## 2015-07-02 NOTE — Anesthesia Preprocedure Evaluation (Signed)
Anesthesia Evaluation  Patient identified by MRN, date of birth, ID band Patient awake    Reviewed: Allergy & Precautions, NPO status , Patient's Chart, lab work & pertinent test results  Airway Mallampati: II  TM Distance: >3 FB Neck ROM: Full    Dental no notable dental hx.    Pulmonary neg pulmonary ROS,    Pulmonary exam normal breath sounds clear to auscultation       Cardiovascular hypertension, Normal cardiovascular exam Rhythm:Regular Rate:Normal     Neuro/Psych negative neurological ROS  negative psych ROS   GI/Hepatic negative GI ROS, Neg liver ROS,   Endo/Other  negative endocrine ROS  Renal/GU negative Renal ROS  negative genitourinary   Musculoskeletal negative musculoskeletal ROS (+)   Abdominal   Peds negative pediatric ROS (+)  Hematology negative hematology ROS (+)   Anesthesia Other Findings   Reproductive/Obstetrics negative OB ROS                             Anesthesia Physical Anesthesia Plan  ASA: II  Anesthesia Plan: Epidural   Post-op Pain Management:    Induction: Intravenous  Airway Management Planned: Natural Airway  Additional Equipment:   Intra-op Plan:   Post-operative Plan:   Informed Consent: I have reviewed the patients History and Physical, chart, labs and discussed the procedure including the risks, benefits and alternatives for the proposed anesthesia with the patient or authorized representative who has indicated his/her understanding and acceptance.   Dental advisory given  Plan Discussed with: CRNA  Anesthesia Plan Comments: (Informed consent obtained prior to proceeding including risk of failure, 1% risk of PDPH, risk of minor discomfort and bruising.  Discussed rare but serious complications including epidural abscess, permanent nerve injury, epidural hematoma.  Discussed alternatives to epidural analgesia and patient desires to  proceed.  Timeout performed pre-procedure verifying patient name, procedure, and platelet count.  Patient tolerated procedure well. )        Anesthesia Quick Evaluation

## 2015-07-02 NOTE — Anesthesia Procedure Notes (Signed)
Epidural Patient location during procedure: OB  Staffing Anesthesiologist: Franne Grip Performed by: anesthesiologist   Preanesthetic Checklist Completed: patient identified, site marked, surgical consent, pre-op evaluation, timeout performed, IV checked, risks and benefits discussed and monitors and equipment checked  Epidural Patient position: sitting Prep: Betadine Patient monitoring: heart rate and blood pressure Approach: midline Location: L3-L4 Injection technique: LOR saline  Needle:  Needle type: Tuohy  Needle gauge: 17 G Needle length: 9 cm and 9 Needle insertion depth: 6 cm Catheter type: closed end flexible Catheter size: 19 Gauge Catheter at skin depth: 11 cm Test dose: negative and Other  Assessment Events: blood not aspirated, injection not painful, no injection resistance, negative IV test and no paresthesia  Additional Notes   Patient tolerated the insertion well without complications.Reason for block:procedure for pain

## 2015-07-02 NOTE — Progress Notes (Signed)
Teresa Myers is a 25 y.o. G1P0000 at [redacted]w[redacted]d by ultrasound admitted for induction of labor due to Hypertension.  Subjective: Feeling CTX with more intensity; last VMP at 0240.  Stadol helping with pain.  Objective: BP 154/99 mmHg  Pulse 95  Temp(Src) 99.2 F (37.3 C) (Oral)  Resp 20  Ht 5\' 4"  (1.626 m)  Wt 160 lb (72.576 kg)  BMI 27.45 kg/m2  SpO2 100%  LMP 09/29/2014      FHT:  FHR: 140 bpm, variability: moderate,  accelerations:  Present,  decelerations:  Absent UC:   irregular, every 3-5 minutes SVE:   Dilation: 1.5 Effacement (%): 70, 80 Station: -2 Exam by:: dr Lynnette Caffey  AROM, clear  Labs: Lab Results  Component Value Date   WBC 8.0 06/30/2015   HGB 10.6* 06/30/2015   HCT 30.6* 06/30/2015   MCV 83.2 06/30/2015   PLT 247 06/30/2015    Assessment / Plan: Induction of labor due to gestational hypertension,  progressing well on pitocin  Labor: Progressing normally Preeclampsia:  labs stable Fetal Wellbeing:  Category I Pain Control:  IV pain meds I/D:  n/a Anticipated MOD:  NSVD  Altha Sweitzer 07/02/2015, 8:29 AM

## 2015-07-03 ENCOUNTER — Encounter (HOSPITAL_COMMUNITY): Payer: Self-pay

## 2015-07-03 LAB — CBC
HCT: 25.9 % — ABNORMAL LOW (ref 36.0–46.0)
HCT: 27.6 % — ABNORMAL LOW (ref 36.0–46.0)
Hemoglobin: 8.9 g/dL — ABNORMAL LOW (ref 12.0–15.0)
Hemoglobin: 9.6 g/dL — ABNORMAL LOW (ref 12.0–15.0)
MCH: 28.2 pg (ref 26.0–34.0)
MCH: 28.6 pg (ref 26.0–34.0)
MCHC: 34.4 g/dL (ref 30.0–36.0)
MCHC: 34.8 g/dL (ref 30.0–36.0)
MCV: 82 fL (ref 78.0–100.0)
MCV: 82.1 fL (ref 78.0–100.0)
PLATELETS: 208 10*3/uL (ref 150–400)
Platelets: 235 10*3/uL (ref 150–400)
RBC: 3.16 MIL/uL — ABNORMAL LOW (ref 3.87–5.11)
RBC: 3.36 MIL/uL — ABNORMAL LOW (ref 3.87–5.11)
RDW: 14.5 % (ref 11.5–15.5)
RDW: 14.6 % (ref 11.5–15.5)
WBC: 13.7 10*3/uL — AB (ref 4.0–10.5)
WBC: 14.9 10*3/uL — AB (ref 4.0–10.5)

## 2015-07-03 LAB — COMPREHENSIVE METABOLIC PANEL
ALT: 13 U/L — ABNORMAL LOW (ref 14–54)
AST: 33 U/L (ref 15–41)
Albumin: 2.2 g/dL — ABNORMAL LOW (ref 3.5–5.0)
Alkaline Phosphatase: 171 U/L — ABNORMAL HIGH (ref 38–126)
Anion gap: 5 (ref 5–15)
BUN: 14 mg/dL (ref 6–20)
CO2: 22 mmol/L (ref 22–32)
Calcium: 8.4 mg/dL — ABNORMAL LOW (ref 8.9–10.3)
Chloride: 108 mmol/L (ref 101–111)
Creatinine, Ser: 1.14 mg/dL — ABNORMAL HIGH (ref 0.44–1.00)
GFR calc Af Amer: >60 mL/min (ref >60)
GFR calc non Af Amer: >60 mL/min (ref >60)
Glucose, Bld: 78 mg/dL (ref 65–99)
Potassium: 3.7 mmol/L (ref 3.5–5.1)
Sodium: 135 mmol/L (ref 135–145)
Total Bilirubin: 0.7 mg/dL (ref 0.3–1.2)
Total Protein: 5.3 g/dL — ABNORMAL LOW (ref 6.5–8.1)

## 2015-07-03 LAB — CREATININE, SERUM
Creatinine, Ser: 1.06 mg/dL — ABNORMAL HIGH (ref 0.44–1.00)
GFR calc Af Amer: >60 mL/min (ref >60)
GFR calc non Af Amer: >60 mL/min (ref >60)

## 2015-07-03 MED ORDER — ACETAMINOPHEN 325 MG PO TABS: 650.0000 mg | ORAL_TABLET | ORAL | Status: DC | PRN

## 2015-07-03 MED ORDER — TETANUS-DIPHTH-ACELL PERTUSSIS 5-2.5-18.5 LF-MCG/0.5 IM SUSP: 0.5000 mL | Freq: Once | INTRAMUSCULAR | Status: DC

## 2015-07-03 MED ORDER — ZOLPIDEM TARTRATE 5 MG PO TABS
5.0000 mg | ORAL_TABLET | Freq: Every evening | ORAL | Status: DC | PRN
Start: 2015-07-03 — End: 2015-07-04

## 2015-07-03 MED ORDER — DIBUCAINE 1 % RE OINT: 1.0000 "application " | TOPICAL_OINTMENT | RECTAL | Status: DC | PRN

## 2015-07-03 MED ORDER — PRENATAL MULTIVITAMIN CH
1.0000 | ORAL_TABLET | Freq: Every day | ORAL | Status: DC
  Administered 2015-07-03 – 2015-07-04 (×2): 1 via ORAL
  Filled 2015-07-03 (×2): qty 1

## 2015-07-03 MED ORDER — OXYCODONE-ACETAMINOPHEN 5-325 MG PO TABS: 2.0000 | ORAL_TABLET | ORAL | Status: DC | PRN

## 2015-07-03 MED ORDER — DIPHENHYDRAMINE HCL 25 MG PO CAPS: 25.0000 mg | ORAL_CAPSULE | Freq: Four times a day (QID) | ORAL | Status: DC | PRN

## 2015-07-03 MED ORDER — SENNOSIDES-DOCUSATE SODIUM 8.6-50 MG PO TABS
2.0000 | ORAL_TABLET | ORAL | Status: DC
  Administered 2015-07-04: 2 via ORAL
  Filled 2015-07-03: qty 2

## 2015-07-03 MED ORDER — ONDANSETRON HCL 4 MG/2ML IJ SOLN: 4.0000 mg | INTRAMUSCULAR | Status: DC | PRN

## 2015-07-03 MED ORDER — BENZOCAINE-MENTHOL 20-0.5 % EX AERO
1.0000 "application " | INHALATION_SPRAY | CUTANEOUS | Status: DC | PRN
  Administered 2015-07-03: 1 via TOPICAL
  Filled 2015-07-03: qty 56

## 2015-07-03 MED ORDER — NIFEDIPINE ER OSMOTIC RELEASE 30 MG PO TB24
30.0000 mg | ORAL_TABLET | Freq: Every day | ORAL | Status: DC
  Administered 2015-07-03 – 2015-07-04 (×2): 30 mg via ORAL
  Filled 2015-07-03 (×2): qty 1

## 2015-07-03 MED ORDER — WITCH HAZEL-GLYCERIN EX PADS: 1.0000 "application " | MEDICATED_PAD | CUTANEOUS | Status: DC | PRN

## 2015-07-03 MED ORDER — GENTAMICIN SULFATE 40 MG/ML IJ SOLN
2.0000 mg/kg | Freq: Once | INTRAVENOUS | Status: AC
  Administered 2015-07-03: 150 mg via INTRAVENOUS
  Filled 2015-07-03: qty 3.75

## 2015-07-03 MED ORDER — OXYCODONE-ACETAMINOPHEN 5-325 MG PO TABS
1.0000 | ORAL_TABLET | ORAL | Status: DC | PRN
  Administered 2015-07-03: 1 via ORAL
  Filled 2015-07-03 (×2): qty 1

## 2015-07-03 MED ORDER — SIMETHICONE 80 MG PO CHEW: 80.0000 mg | CHEWABLE_TABLET | ORAL | Status: DC | PRN

## 2015-07-03 MED ORDER — ONDANSETRON HCL 4 MG PO TABS: 4.0000 mg | ORAL_TABLET | ORAL | Status: DC | PRN

## 2015-07-03 MED ORDER — SODIUM CHLORIDE 0.9 % IV SOLN
2.0000 g | Freq: Once | INTRAVENOUS | Status: AC
  Administered 2015-07-03: 2 g via INTRAVENOUS
  Filled 2015-07-03: qty 2000

## 2015-07-03 MED ORDER — IBUPROFEN 600 MG PO TABS
600.0000 mg | ORAL_TABLET | Freq: Four times a day (QID) | ORAL | Status: DC
  Administered 2015-07-03 – 2015-07-04 (×6): 600 mg via ORAL
  Filled 2015-07-03 (×6): qty 1

## 2015-07-03 MED ORDER — COCONUT OIL OIL
1.0000 "application " | TOPICAL_OIL | Status: DC | PRN
  Administered 2015-07-03: 1 via TOPICAL
  Filled 2015-07-03: qty 120

## 2015-07-03 NOTE — Anesthesia Postprocedure Evaluation (Signed)
Anesthesia Post Note  Patient: Teresa Myers  Procedure(s) Performed: * No procedures listed *  Patient location during evaluation: Mother Baby Anesthesia Type: Epidural Level of consciousness: awake Pain management: satisfactory to patient Vital Signs Assessment: post-procedure vital signs reviewed and stable Respiratory status: spontaneous breathing Cardiovascular status: stable Anesthetic complications: no     Last Vitals:  Filed Vitals:   07/03/15 0708 07/03/15 0710  BP: 150/92 150/88  Pulse:    Temp:    Resp:      Last Pain:  Filed Vitals:   07/03/15 0711  PainSc: 6    Pain Goal: Patients Stated Pain Goal: 4 (07/02/15 0230)               Casimer Lanius

## 2015-07-03 NOTE — Progress Notes (Addendum)
Post Partum Day 1 Subjective: no complaints, up ad lib, voiding and tolerating PO.  No HA, CP/SOB, RUQ pain, or visual disturbance.  Objective: Blood pressure 150/88, pulse 94, temperature 99.4 F (37.4 C), temperature source Oral, resp. rate 18, height 5\' 4"  (1.626 m), weight 160 lb (72.576 kg), last menstrual period 09/29/2014, SpO2 100 %, unknown if currently breastfeeding.  Physical Exam:  General: alert, cooperative and appears stated age Lochia: appropriate Uterine Fundus: firm Incision: n/a DVT Evaluation: No evidence of DVT seen on physical exam. Negative Homan's sign. No cords or calf tenderness. DTRs 2+   Recent Labs  07/03/15 0041 07/03/15 0547  HGB 9.6* 8.9*  HCT 27.6* 25.9*  Cr 1.14  Assessment/Plan: Plan for discharge tomorrow, Breastfeeding and Circumcision prior to discharge  GHTN-monitor BPs and symptoms.  Tx BP with po meds if persistently elevated.  Rpt labs tomorrow AM; ensure fall of creatinine. Chorio-AF since just prior to delivery.  Will monitor. Circ-Patient counseled for circ including risk of bleeding, infection, and scarring.  All questions were answered and the patient wishes to proceed.  Will do tomorrow.   LOS: 3 days   Orla Jolliff 07/03/2015, 9:59 AM

## 2015-07-03 NOTE — Lactation Note (Addendum)
This note was copied from a baby's chart. Lactation Consultation Note  Baby is 15 hours of life and is not BF well.  Mom has erect nipples and reports that baby is only attaching to the nipple.  His tongue is blade-like with extension and a frenum is noted under his tongue.  When he was unswaddled he immediately began crying as though he was in pain and anytime that he was repositioned he cried. He was placed in a laid back BF position and and began to crawl to the breast but had difficulty attaching. He suckled well when a gloved finger was inserted deeply into his mouth. IBCLC was not able to get him to attach deep enough to stimulate suckling.  A #24 NS was initiated and Renato Battles held onto it but did not suckle rhythmically so a # 20 was used. He did much better with this and periodic swallows were heard. Mother used breast compressions to keep him interested.  When he detached colostrum was in the shield. He was transferred to the right breast and though he cried he engaged much easier and swallows were heard. Encouraged parents to watch for early feeding cues to make BF easier. Mom was taught hand expression and colostrum was expressed. Handling and positioning is difficult for mother related to his sensitivity to movement.  Follow-up planned for tomorrow. Patient Name: Boy Genifer Darney [id number] Date: 07/03/2015 Reason for consult: Initial assessment   Maternal Data Has patient been taught Hand Expression?: Yes  Feeding Feeding Type: Breast Fed Length of feed: 0 min  LATCH Score/Interventions Latch: Repeated attempts needed to sustain latch, nipple held in mouth throughout feeding, stimulation needed to elicit sucking reflex. Intervention(s): Adjust position;Breast compression  Audible Swallowing: A few with stimulation Intervention(s): Skin to skin;Hand expression  Type of Nipple: Everted at rest and after stimulation (ns for depth)  Comfort (Breast/Nipple): Soft / non-tender     Hold  (Positioning): Assistance needed to correctly position infant at breast and maintain latch.  LATCH Score: 7  Lactation Tools Discussed/Used Tools: Nipple Shields Nipple shield size: 20   Consult Status Consult Status: Follow-up Date: 07/04/15 Follow-up type: In-patient    Van Clines 07/03/2015, 2:31 PM

## 2015-07-04 ENCOUNTER — Inpatient Hospital Stay (HOSPITAL_COMMUNITY): Payer: Self-pay

## 2015-07-04 LAB — CBC
HCT: 23.4 % — ABNORMAL LOW (ref 36.0–46.0)
Hemoglobin: 8.3 g/dL — ABNORMAL LOW (ref 12.0–15.0)
MCH: 29.2 pg (ref 26.0–34.0)
MCHC: 35.5 g/dL (ref 30.0–36.0)
MCV: 82.4 fL (ref 78.0–100.0)
PLATELETS: 201 10*3/uL (ref 150–400)
RBC: 2.84 MIL/uL — ABNORMAL LOW (ref 3.87–5.11)
RDW: 14.4 % (ref 11.5–15.5)
WBC: 11.9 10*3/uL — AB (ref 4.0–10.5)

## 2015-07-04 LAB — COMPREHENSIVE METABOLIC PANEL
ALT: 16 U/L (ref 14–54)
AST: 45 U/L — ABNORMAL HIGH (ref 15–41)
Albumin: 2.4 g/dL — ABNORMAL LOW (ref 3.5–5.0)
Alkaline Phosphatase: 152 U/L — ABNORMAL HIGH (ref 38–126)
Anion gap: 5 (ref 5–15)
BUN: 15 mg/dL (ref 6–20)
CALCIUM: 8.5 mg/dL — AB (ref 8.9–10.3)
CO2: 24 mmol/L (ref 22–32)
Chloride: 108 mmol/L (ref 101–111)
Creatinine, Ser: 0.87 mg/dL (ref 0.44–1.00)
GFR calc Af Amer: >60 mL/min (ref >60)
GFR calc non Af Amer: >60 mL/min (ref >60)
GLUCOSE: 70 mg/dL (ref 65–99)
Potassium: 3.5 mmol/L (ref 3.5–5.1)
SODIUM: 137 mmol/L (ref 135–145)
Total Bilirubin: 0.5 mg/dL (ref 0.3–1.2)
Total Protein: 5.4 g/dL — ABNORMAL LOW (ref 6.5–8.1)

## 2015-07-04 MED ORDER — OXYCODONE-ACETAMINOPHEN 5-325 MG PO TABS: 1.0000 | ORAL_TABLET | ORAL | Status: DC | PRN

## 2015-07-04 MED ORDER — IBUPROFEN 600 MG PO TABS: 600.0000 mg | ORAL_TABLET | Freq: Four times a day (QID) | ORAL | Status: DC

## 2015-07-04 MED ORDER — NIFEDIPINE ER 30 MG PO TB24: 30.0000 mg | ORAL_TABLET | Freq: Every day | ORAL | Status: DC

## 2015-07-04 MED ORDER — MEASLES, MUMPS & RUBELLA VAC ~~LOC~~ INJ
0.5000 mL | INJECTION | Freq: Once | SUBCUTANEOUS | Status: AC
  Administered 2015-07-04: 0.5 mL via SUBCUTANEOUS
  Filled 2015-07-04 (×2): qty 0.5

## 2015-07-04 NOTE — Progress Notes (Signed)
Pt blood pressure 135/92. Megan Morris DO at bed side with pt. No new orders given. Will continue to monitor pt.

## 2015-07-04 NOTE — Discharge Instructions (Signed)
Iron-Rich Diet Iron is a mineral that helps your body to produce hemoglobin. Hemoglobin is a protein in your red blood cells that carries oxygen to your body's tissues. Eating too little iron may cause you to feel weak and tired, and it can increase your risk for infection. Eating enough iron is necessary for your body's metabolism, muscle function, and nervous system. Iron is naturally found in many foods. It can also be added to foods or fortified in foods. There are two types of dietary iron:  Heme iron. Heme iron is absorbed by the body more easily than nonheme iron. Heme iron is found in meat, poultry, and fish.  Nonheme iron. Nonheme iron is found in dietary supplements, iron-fortified grains, beans, and vegetables. You may need to follow an iron-rich diet if:  You have been diagnosed with iron deficiency or iron-deficiency anemia.  You have a condition that prevents you from absorbing dietary iron, such as:  Infection in your intestines.  Celiac disease. This involves long-lasting (chronic) inflammation of your intestines.  You do not eat enough iron.  You eat a diet that is high in foods that impair iron absorption.  You have lost a lot of blood.  You have heavy bleeding during your menstrual cycle.  You are pregnant. WHAT IS MY PLAN? Your health care provider may help you to determine how much iron you need per day based on your condition. Generally, when a person consumes sufficient amounts of iron in the diet, the following iron needs are met:  Men.  14-18 years old: 11 mg per day.  19-50 years old: 8 mg per day.  Women.   14-18 years old: 15 mg per day.  19-50 years old: 18 mg per day.  Over 50 years old: 8 mg per day.  Pregnant women: 27 mg per day.  Breastfeeding women: 9 mg per day. WHAT DO I NEED TO KNOW ABOUT AN IRON-RICH DIET?  Eat fresh fruits and vegetables that are high in vitamin C along with foods that are high in iron. This will help increase  the amount of iron that your body absorbs from food, especially with foods containing nonheme iron. Foods that are high in vitamin C include oranges, peppers, tomatoes, and mango.  Take iron supplements only as directed by your health care provider. Overdose of iron can be life-threatening. If you were prescribed iron supplements, take them with orange juice or a vitamin C supplement.  Cook foods in pots and pans that are made from iron.   Eat nonheme iron-containing foods alongside foods that are high in heme iron. This helps to improve your iron absorption.   Certain foods and drinks contain compounds that impair iron absorption. Avoid eating these foods in the same meal as iron-rich foods or with iron supplements. These include:  Coffee, black tea, and red wine.  Milk, dairy products, and foods that are high in calcium.  Beans, soybeans, and peas.  Whole grains.  When eating foods that contain both nonheme iron and compounds that impair iron absorption, follow these tips to absorb iron better.   Soak beans overnight before cooking.  Soak whole grains overnight and drain them before using.  Ferment flours before baking, such as using yeast in bread dough. WHAT FOODS CAN I EAT? Grains Iron-fortified breakfast cereal. Iron-fortified whole-wheat bread. Enriched rice. Sprouted grains. Vegetables Spinach. Potatoes with skin. Green peas. Broccoli. Red and green bell peppers. Fermented vegetables. Fruits Prunes. Raisins. Oranges. Strawberries. Mango. Grapefruit. Meats and Other Protein   Other Protein Sources Beef liver. Oysters. Beef. Shrimp. [place]. Chicken. [place]. Sardines. Chickpeas. Nuts. Tofu. Beverages Tomato juice. Fresh orange juice. Prune juice. Hibiscus tea. Fortified instant breakfast shakes. Condiments Tahini. Fermented soy sauce. Sweets and Desserts Black-strap molasses.  Other Wheat germ. The items listed above may not be a complete list of recommended foods or  beverages. Contact your dietitian for more options. WHAT FOODS ARE NOT RECOMMENDED? Grains Whole grains. Bran cereal. Bran flour. Oats. Vegetables Artichokes. Brussels sprouts. Kale. Fruits Blueberries. Raspberries. Strawberries. Figs. Meats and Other Protein Sources Soybeans. Products made from soy protein. Dairy Milk. Cream. Cheese. Yogurt. Cottage cheese. Beverages Coffee. Black tea. Red wine. Sweets and Desserts Cocoa. Chocolate. Ice cream. Other Basil. Oregano. Parsley. The items listed above may not be a complete list of foods and beverages to avoid. Contact your dietitian for more information.   This information is not intended to replace advice given to you by your health care provider. Make sure you discuss any questions you have with your health care provider.   Document Released: 08/16/2004 Document Revised: 01/23/2014 Document Reviewed: 07/30/2013 Elsevier Interactive Patient Education 2016 [employer].    Call MD for T>100.4, heavy vaginal bleeding, severe abdominal pain, intractable nausea and/or vomiting, or respiratory distress.  Call office to schedule BP check in 1 week.  No driving while taking narcotics.  Pelvic rest x 6 weeks.

## 2015-07-04 NOTE — Progress Notes (Addendum)
Post Partum Day 2 Subjective: no complaints, up ad lib, voiding and tolerating PO.  Baby to NICU overnight because of fever.  RN concerned re: "cyst in abdomen."  No s/sx preE.  Objective: Blood pressure 129/62, pulse 86, temperature 97.8 F (36.6 C), temperature source Oral, resp. rate 86, height 5\' 4"  (1.626 m), weight 160 lb (72.576 kg), last menstrual period 09/29/2014, SpO2 100 %, unknown if currently breastfeeding.  Physical Exam:  General: alert, cooperative and appears stated age Lochia: appropriate Uterine Fundus: firm with ~4cm mass at right of fundus; tender Incision: healing well, no significant drainage, no dehiscence DVT Evaluation: No evidence of DVT seen on physical exam. Negative Homan's sign. No cords or calf tenderness.   Recent Labs  07/03/15 0547 07/04/15 0543  HGB 8.9* 8.3*  HCT 25.9* 23.4*    Assessment/Plan: Discharge home and Breastfeeding  U/S today for abdominal mass; suspect fibroid GHTN-Procardia 30XL q days started yesterday and BPs improved.  Labs wnl.   LOS: 4 days   Briza Bark 07/04/2015, 11:18 AM

## 2015-07-04 NOTE — Progress Notes (Signed)
Pelvic u/s done.  Suspect fibroid.  Will d/c pt home.  Linda Hedges, DO

## 2015-07-04 NOTE — Lactation Note (Addendum)
Lactation Consultation Note  Explained the benefits of breastfeeding for infant in NICU. Encouraged mother to pump every hours with the exception of once during the night to provide baby with breastmilk. Helped mother pump, drops expressed.  Reviewed hand expression and rubbing ebm to nipples for soreness. Reviewed milk storage and cleaning. Provided family with breastmilk labels, NICU booklet and colostrum containers. Recommend family call insurance copy for DEBP.  Provided family w/ 2 week pump rental paperwork and call [facility] when ready to rent pump. Reviewed engorgement care.    Patient Name: Teresa Myers [id number] Date: 07/04/2015 Reason for consult: Follow-up assessment   Maternal Data    Feeding    LATCH Score/Interventions                      Lactation Tools Discussed/Used Pump Review: Setup, frequency, and cleaning;Milk Storage Initiated by:: Vivianne Master RN Date initiated:: 07/05/15   Consult Status Consult Status: Follow-up Date: 07/05/15 Follow-up type: In-patient    Vivianne Master [facility] 07/04/2015, 3:12 PM

## 2015-07-04 NOTE — Clinical Social Work Maternal (Signed)
  CLINICAL SOCIAL WORK MATERNAL/CHILD NOTE  Patient Details  Name: Teresa Myers MRN: [id number] Date of Birth: 1990/03/27  Date:  07/04/2015  Clinical Social Worker Initiating Note:  Erasmo Downer Tesla Bochicchio, LCSW Date/ Time Initiated:  07/04/15/      Child's Name:  unknown   Legal Guardian:  Mother   Need for Interpreter:  None   Date of Referral:  07/03/15     Reason for Referral:  Behavioral Health Issues, including SI , Parental Support of Premature Babies < 67 weeks/or Critically Ill babies    Referral Source:  Physician   Address:  [address] [place] [place] [address]  Phone number:  [phone]   Household Members:  Self, Spouse   Natural Supports (not living in the home):  Immediate Family   Professional Supports: None   Employment: Unemployed   Type of Work:     Education:   (unknown)   Museum/gallery curator Resources:  Multimedia programmer   Other Resources:      Cultural/Religious Considerations Which May Impact Care:  None indicated by family  Strengths:  Ability to meet basic needs , Home prepared for child , Compliance with medical plan    Risk Factors/Current Problems:  None   Cognitive State:  Alert , Able to Concentrate    Mood/Affect:  Blunted    CSW Assessment: CSW consulted for concerns of history of panic attacks and baby being admitted into NICU. CSW met with patient and husband Teresa Myers who was present at the bedside. They live together in an apartment, husband Teresa Myers has 3 children from previous relationship that do not live with them. [name] is unemployed, husband works. They report strong level of family support, report that home is prepared for baby. No needs or questions at this time. Patient denies current symptoms of depression or anxiety prior to admission. CSW provided information on postpartum depression and weekday CSW contact information if any further needs should arise.   CSW Plan/Description:  No Further Intervention Required/No  Barriers to Discharge    Jennie Bolar, Casimiro Needle, LCSW 07/04/2015, 3:14 PM

## 2015-07-04 NOTE — Discharge Summary (Signed)
Obstetric Discharge Summary Reason for Admission: induction of labor for [facility] Prenatal Procedures: none Intrapartum Procedures: spontaneous vaginal delivery Postpartum Procedures: antibiotics for chorioamnionitis diagnosed just prior to delivery Complications-Operative and Postpartum: periclitoral laceration HEMOGLOBIN  Date Value Ref Range Status  07/04/2015 8.3* 12.0 - 15.0 g/dL Final   HCT  Date Value Ref Range Status  07/04/2015 23.4* 36.0 - 46.0 % Final    Physical Exam:  General: alert, cooperative and appears stated age 89: appropriate Uterine Fundus: firm Incision: healing well, no significant drainage, no dehiscence DVT Evaluation: No evidence of DVT seen on physical exam. Negative Homan's sign. No cords or calf tenderness.  Discharge Diagnoses: Term Pregnancy-delivered  Discharge Information: Date: 07/04/2015 Activity: pelvic rest Diet: routine Medications: PNV, Ibuprofen, Percocet and Procardia Condition: stable Instructions: refer to practice specific booklet Discharge to: home   Newborn Data: Live born female  Birth Weight: 7 lb 5.5 oz (3330 g) APGAR: 8, 9  Home with NICU.  Rachelanne Whidby, Patrick 07/04/2015, 3:18 PM

## 2017-03-26 ENCOUNTER — Other Ambulatory Visit: Payer: Self-pay | Admitting: Obstetrics and Gynecology

## 2017-03-26 ENCOUNTER — Other Ambulatory Visit (HOSPITAL_COMMUNITY)
Admission: RE | Admit: 2017-03-26 | Discharge: 2017-03-26 | Disposition: A | Discharge status: Home / Self Care | Payer: BLUE CROSS/BLUE SHIELD | Source: Ambulatory Visit | Attending: Obstetrics and Gynecology | Admitting: Obstetrics and Gynecology

## 2017-03-26 DIAGNOSIS — Z01411 Encounter for gynecological examination (general) (routine) with abnormal findings: Secondary | ICD-10-CM | POA: Insufficient documentation

## 2017-03-30 LAB — CYTOLOGY - PAP

## 2017-05-23 DIAGNOSIS — R634 Abnormal weight loss: Secondary | ICD-10-CM | POA: Diagnosis not present

## 2017-05-23 DIAGNOSIS — R42 Dizziness and giddiness: Secondary | ICD-10-CM | POA: Diagnosis not present

## 2017-06-27 DIAGNOSIS — Z681 Body mass index (BMI) 19 or less, adult: Secondary | ICD-10-CM | POA: Diagnosis not present

## 2017-06-27 DIAGNOSIS — N898 Other specified noninflammatory disorders of vagina: Secondary | ICD-10-CM | POA: Diagnosis not present

## 2017-06-27 DIAGNOSIS — B373 Candidiasis of vulva and vagina: Secondary | ICD-10-CM | POA: Diagnosis not present

## 2017-06-27 DIAGNOSIS — N760 Acute vaginitis: Secondary | ICD-10-CM | POA: Diagnosis not present

## 2018-03-08 DIAGNOSIS — H6693 Otitis media, unspecified, bilateral: Secondary | ICD-10-CM | POA: Diagnosis not present

## 2018-04-04 DIAGNOSIS — N898 Other specified noninflammatory disorders of vagina: Secondary | ICD-10-CM | POA: Diagnosis not present

## 2018-04-04 DIAGNOSIS — J309 Allergic rhinitis, unspecified: Secondary | ICD-10-CM | POA: Diagnosis not present

## 2018-04-04 DIAGNOSIS — L858 Other specified epidermal thickening: Secondary | ICD-10-CM | POA: Diagnosis not present

## 2018-07-31 LAB — OB RESULTS CONSOLE GC/CHLAMYDIA
Chlamydia: NEGATIVE
Gonorrhea: NEGATIVE

## 2018-07-31 LAB — OB RESULTS CONSOLE RUBELLA ANTIBODY, IGM: Rubella: IMMUNE

## 2018-07-31 LAB — OB RESULTS CONSOLE RPR: RPR: NONREACTIVE

## 2018-07-31 LAB — OB RESULTS CONSOLE HEPATITIS B SURFACE ANTIGEN: Hepatitis B Surface Ag: NEGATIVE

## 2018-07-31 LAB — OB RESULTS CONSOLE ANTIBODY SCREEN: Antibody Screen: NEGATIVE

## 2018-07-31 LAB — OB RESULTS CONSOLE ABO/RH: RH Type: POSITIVE

## 2018-07-31 LAB — OB RESULTS CONSOLE HIV ANTIBODY (ROUTINE TESTING): HIV: NONREACTIVE

## 2018-09-02 ENCOUNTER — Other Ambulatory Visit: Payer: Self-pay | Admitting: Obstetrics and Gynecology

## 2018-09-02 ENCOUNTER — Other Ambulatory Visit (HOSPITAL_COMMUNITY)
Admission: RE | Admit: 2018-09-02 | Discharge: 2018-09-02 | Disposition: A | Discharge status: Home / Self Care | Payer: 59 | Source: Ambulatory Visit | Attending: Obstetrics and Gynecology | Admitting: Obstetrics and Gynecology

## 2018-09-02 DIAGNOSIS — Z124 Encounter for screening for malignant neoplasm of cervix: Secondary | ICD-10-CM | POA: Insufficient documentation

## 2018-09-02 LAB — OB RESULTS CONSOLE GBS: GBS: POSITIVE

## 2018-09-04 LAB — CYTOLOGY - PAP
Diagnosis: NEGATIVE
HPV: NOT DETECTED

## 2019-01-08 ENCOUNTER — Other Ambulatory Visit: Payer: Self-pay

## 2019-01-08 ENCOUNTER — Encounter: Payer: 59 | Attending: Obstetrics and Gynecology | Admitting: Registered"

## 2019-01-08 DIAGNOSIS — O99810 Abnormal glucose complicating pregnancy: Secondary | ICD-10-CM | POA: Insufficient documentation

## 2019-01-09 ENCOUNTER — Encounter: Payer: Self-pay | Admitting: Registered"

## 2019-01-09 NOTE — Progress Notes (Signed)
Patient was seen on 01/08/2019 for Gestational Diabetes self-management class at [facility]. The following learning objectives were met by the patient during this course:   States the definition of Gestational Diabetes  States why dietary management is important in controlling blood glucose  Describes the effects each nutrient has on blood glucose levels  Demonstrates ability to create a balanced meal plan  Demonstrates carbohydrate counting   States when to check blood glucose levels  Demonstrates proper blood glucose monitoring techniques  States the effect of stress and exercise on blood glucose levels  States the importance of limiting caffeine and abstaining from alcohol and smoking  Blood glucose monitor given: none   Patient instructed to monitor glucose levels: FBS: 60 - <95; 1 hour: <140; 2 hour: <120  Patient received handouts:  Nutrition Diabetes and Pregnancy, including carb counting list  Patient will be seen for follow-up as needed.

## 2019-01-17 NOTE — L&D Delivery Note (Signed)
Delivery Note At 5:15 PM a viable female was delivered via Vaginal, Spontaneous (Presentation: Right Occiput Anterior).  APGAR: 9, 9; weight pending .   Placenta status: Spontaneous, Intact.  Cord: 3 vessels with the following complications: None. Nuchal cord x 1, manually reduced Cord pH: n/a  Anesthesia: Epidural Episiotomy: None Lacerations: 1st degree;Perineal abrasion to right of perineum Suture Repair: 4.0 Vicryl Est. Blood Loss (mL):  Less than 200  Mom to postpartum.  Baby to Couplet care / Skin to Skin.  Thurnell Lose 03/21/2019, 6:05 PM

## 2019-03-19 ENCOUNTER — Other Ambulatory Visit (HOSPITAL_COMMUNITY)
Admission: RE | Admit: 2019-03-19 | Discharge: 2019-03-19 | Disposition: A | Discharge status: Home / Self Care | Payer: MEDICAID | Source: Ambulatory Visit | Attending: Obstetrics & Gynecology | Admitting: Obstetrics & Gynecology

## 2019-03-19 ENCOUNTER — Telehealth (HOSPITAL_COMMUNITY): Payer: Self-pay | Admitting: *Deleted

## 2019-03-19 ENCOUNTER — Encounter (HOSPITAL_COMMUNITY): Payer: Self-pay | Admitting: *Deleted

## 2019-03-19 DIAGNOSIS — Z01812 Encounter for preprocedural laboratory examination: Secondary | ICD-10-CM | POA: Insufficient documentation

## 2019-03-19 DIAGNOSIS — Z20822 Contact with and (suspected) exposure to covid-19: Secondary | ICD-10-CM | POA: Insufficient documentation

## 2019-03-19 LAB — SARS CORONAVIRUS 2 (TAT 6-24 HRS): SARS Coronavirus 2: NEGATIVE

## 2019-03-19 NOTE — Telephone Encounter (Signed)
Preadmission screen  

## 2019-03-20 NOTE — H&P (Signed)
Teresa Myers is a 29 y.o. female G2 P1001 @ 39 1/7 weeks will be admitted for induction of labor due to Gestational Diabetes, diet controlled.    [place] with [facility] Simona Huh) complicated by the above and: GBS+ by urine.    OB History    Gravida  2   Para  1   Term  1   Preterm  0   AB  0   Living  1     SAB  0   TAB  0   Ectopic  0   Multiple  0   Live Births  1          Past Medical History:  Diagnosis Date  . Gestational diabetes   . History of gestational hypertension   . Hx of gonorrhea   . Medical history non-contributory   . Vaginal Pap smear, abnormal    HPV 2019   Past Surgical History:  Procedure Laterality Date  . VAGINA SURGERY  2011   laser surgery   Family History: family history includes Diabetes in her father. Social History:  reports that she has never smoked. She has never used smokeless tobacco. She reports that she does not drink alcohol or use drugs.     Maternal Diabetes: Yes:  Diabetes Type:  Diet controlled Genetic Screening: Normal Maternal Ultrasounds/Referrals: Normal Fetal Ultrasounds or other Referrals:  None Maternal Substance Abuse:  No Significant Maternal Medications:  None Significant Maternal Lab Results:  Group B Strep positive Other Comments:  None  Review of Systems  Constitutional: Positive for fatigue.  Respiratory: Negative for cough and shortness of breath.   Gastrointestinal: Negative for abdominal pain.  Genitourinary: Positive for vaginal discharge.   Maternal Medical History:  Contractions: Perceived severity is mild.    Fetal activity: Perceived fetal activity is normal.    Prenatal Complications - Diabetes: gestational. Diabetes is managed by diet.        Last menstrual period 06/17/2018, unknown if currently breastfeeding. Maternal Exam:  Abdomen: Estimated fetal weight is 03/17/19 7 lbs +/- 7 oz (74%).   Fetal presentation: vertex  Introitus: Normal vulva. Ferning test: negative.    Pelvis: adequate for delivery.   Cervix: 1/Th/-3  Fetal Exam Fetal Monitor Review: Mode: ultrasound.   Baseline rate: 150.      Physical Exam  Constitutional: She is oriented to person, place, and time. She appears well-developed and well-nourished. No distress.  HENT:  Head: Normocephalic and atraumatic.  Eyes: EOM are normal.  Respiratory: Effort normal. No respiratory distress.  GI: There is no abdominal tenderness.  Genitourinary:    Vulva normal.   Musculoskeletal:        General: Edema present. Normal range of motion.     Cervical back: Normal range of motion.  Neurological: She is alert and oriented to person, place, and time.  Skin: Skin is warm and dry.  Psychiatric: She has a normal mood and affect.    Prenatal labs: ABO, Rh: O/Positive/-- (07/15 0000) Antibody: Negative (07/15 0000) Rubella: Immune (07/15 0000) RPR: Nonreactive (07/15 0000)  HBsAg: Negative (07/15 0000)  HIV: Non-reactive (07/15 0000)  GBS: Positive/-- (08/17 0000)   Assessment/Plan: IUP @ 39 1/7 weeks for IOL at 39 4/7 weeks. Gest DM diet controlled. IOL with Cytotec.    Thurnell Lose

## 2019-03-21 ENCOUNTER — Inpatient Hospital Stay (HOSPITAL_COMMUNITY): Payer: 59

## 2019-03-21 ENCOUNTER — Other Ambulatory Visit: Payer: Self-pay

## 2019-03-21 ENCOUNTER — Inpatient Hospital Stay (HOSPITAL_COMMUNITY): Payer: 59 | Admitting: Anesthesiology

## 2019-03-21 ENCOUNTER — Inpatient Hospital Stay (HOSPITAL_COMMUNITY)
Admission: AD | Admit: 2019-03-21 | Discharge: 2019-03-23 | DRG: 807 | Disposition: A | Discharge status: Home / Self Care | Payer: 59 | Attending: Obstetrics and Gynecology | Admitting: Obstetrics and Gynecology

## 2019-03-21 ENCOUNTER — Encounter (HOSPITAL_COMMUNITY): Payer: Self-pay | Admitting: Obstetrics and Gynecology

## 2019-03-21 DIAGNOSIS — O99824 Streptococcus B carrier state complicating childbirth: Secondary | ICD-10-CM | POA: Diagnosis present

## 2019-03-21 DIAGNOSIS — Z3A39 39 weeks gestation of pregnancy: Secondary | ICD-10-CM | POA: Diagnosis not present

## 2019-03-21 DIAGNOSIS — Z3490 Encounter for supervision of normal pregnancy, unspecified, unspecified trimester: Secondary | ICD-10-CM | POA: Diagnosis present

## 2019-03-21 DIAGNOSIS — O24420 Gestational diabetes mellitus in childbirth, diet controlled: Secondary | ICD-10-CM | POA: Diagnosis present

## 2019-03-21 LAB — TYPE AND SCREEN
ABO/RH(D): O POS
Antibody Screen: NEGATIVE

## 2019-03-21 LAB — CBC
HCT: 34.8 % — ABNORMAL LOW (ref 36.0–46.0)
Hemoglobin: 11.9 g/dL — ABNORMAL LOW (ref 12.0–15.0)
MCH: 29.5 pg (ref 26.0–34.0)
MCHC: 34.2 g/dL (ref 30.0–36.0)
MCV: 86.1 fL (ref 80.0–100.0)
Platelets: 249 10*3/uL (ref 150–400)
RBC: 4.04 MIL/uL (ref 3.87–5.11)
RDW: 12.9 % (ref 11.5–15.5)
WBC: 6.4 10*3/uL (ref 4.0–10.5)
nRBC: 0 % (ref 0.0–0.2)

## 2019-03-21 LAB — GLUCOSE, CAPILLARY
Glucose-Capillary: 106 mg/dL — ABNORMAL HIGH (ref 70–99)
Glucose-Capillary: 76 mg/dL (ref 70–99)
Glucose-Capillary: 79 mg/dL (ref 70–99)
Glucose-Capillary: 97 mg/dL (ref 70–99)

## 2019-03-21 LAB — RPR: RPR Ser Ql: NONREACTIVE

## 2019-03-21 LAB — ABO/RH: ABO/RH(D): O POS

## 2019-03-21 MED ORDER — OXYCODONE-ACETAMINOPHEN 5-325 MG PO TABS: 2.0000 | ORAL_TABLET | ORAL | Status: DC | PRN

## 2019-03-21 MED ORDER — BENZOCAINE-MENTHOL 20-0.5 % EX AERO
1.0000 "application " | INHALATION_SPRAY | CUTANEOUS | Status: DC | PRN
  Administered 2019-03-21: 1 via TOPICAL
  Filled 2019-03-21: qty 56

## 2019-03-21 MED ORDER — IBUPROFEN 600 MG PO TABS
600.0000 mg | ORAL_TABLET | Freq: Four times a day (QID) | ORAL | Status: DC
  Administered 2019-03-21 – 2019-03-23 (×7): 600 mg via ORAL
  Filled 2019-03-21 (×8): qty 1

## 2019-03-21 MED ORDER — DIPHENHYDRAMINE HCL 50 MG/ML IJ SOLN
12.5000 mg | INTRAMUSCULAR | Status: DC | PRN
  Administered 2019-03-21: 12.5 mg via INTRAVENOUS
  Filled 2019-03-21: qty 1

## 2019-03-21 MED ORDER — EPHEDRINE 5 MG/ML INJ: 10.0000 mg | INTRAVENOUS | Status: DC | PRN

## 2019-03-21 MED ORDER — LIDOCAINE HCL (PF) 1 % IJ SOLN: 30.0000 mL | INTRAMUSCULAR | Status: DC | PRN

## 2019-03-21 MED ORDER — FENTANYL-BUPIVACAINE-NACL 0.5-0.125-0.9 MG/250ML-% EP SOLN
12.0000 mL/h | EPIDURAL | Status: DC | PRN
  Filled 2019-03-21: qty 250

## 2019-03-21 MED ORDER — OXYCODONE-ACETAMINOPHEN 5-325 MG PO TABS: 1.0000 | ORAL_TABLET | ORAL | Status: DC | PRN

## 2019-03-21 MED ORDER — ZOLPIDEM TARTRATE 5 MG PO TABS: 5.0000 mg | ORAL_TABLET | Freq: Every evening | ORAL | Status: DC | PRN

## 2019-03-21 MED ORDER — TERBUTALINE SULFATE 1 MG/ML IJ SOLN: 0.2500 mg | Freq: Once | INTRAMUSCULAR | Status: DC | PRN

## 2019-03-21 MED ORDER — OXYCODONE HCL 5 MG PO TABS: 10.0000 mg | ORAL_TABLET | ORAL | Status: DC | PRN

## 2019-03-21 MED ORDER — BUTORPHANOL TARTRATE 1 MG/ML IJ SOLN: 2.0000 mg | INTRAMUSCULAR | Status: DC | PRN

## 2019-03-21 MED ORDER — MISOPROSTOL 25 MCG QUARTER TABLET
25.0000 ug | ORAL_TABLET | ORAL | Status: DC | PRN
  Administered 2019-03-21 (×2): 25 ug via VAGINAL
  Filled 2019-03-21 (×2): qty 1

## 2019-03-21 MED ORDER — LACTATED RINGERS IV SOLN
500.0000 mL | Freq: Once | INTRAVENOUS | Status: AC
  Administered 2019-03-21: 500 mL via INTRAVENOUS

## 2019-03-21 MED ORDER — COCONUT OIL OIL
1.0000 "application " | TOPICAL_OIL | Status: DC | PRN
  Administered 2019-03-21: 1 via TOPICAL

## 2019-03-21 MED ORDER — PHENYLEPHRINE 40 MCG/ML (10ML) SYRINGE FOR IV PUSH (FOR BLOOD PRESSURE SUPPORT): 80.0000 ug | PREFILLED_SYRINGE | INTRAVENOUS | Status: DC | PRN

## 2019-03-21 MED ORDER — ONDANSETRON HCL 4 MG/2ML IJ SOLN: 4.0000 mg | Freq: Four times a day (QID) | INTRAMUSCULAR | Status: DC | PRN

## 2019-03-21 MED ORDER — MAGNESIUM HYDROXIDE 400 MG/5ML PO SUSP: 30.0000 mL | ORAL | Status: DC | PRN

## 2019-03-21 MED ORDER — SODIUM CHLORIDE (PF) 0.9 % IJ SOLN
INTRAMUSCULAR | Status: DC | PRN
  Administered 2019-03-21: 12 mL/h via EPIDURAL

## 2019-03-21 MED ORDER — DIPHENHYDRAMINE HCL 25 MG PO CAPS: 25.0000 mg | ORAL_CAPSULE | Freq: Four times a day (QID) | ORAL | Status: DC | PRN

## 2019-03-21 MED ORDER — SENNOSIDES-DOCUSATE SODIUM 8.6-50 MG PO TABS
2.0000 | ORAL_TABLET | ORAL | Status: DC
  Administered 2019-03-21 – 2019-03-22 (×2): 2 via ORAL
  Filled 2019-03-21 (×2): qty 2

## 2019-03-21 MED ORDER — OXYTOCIN 40 UNITS IN NORMAL SALINE INFUSION - SIMPLE MED: 2.5000 [IU]/h | INTRAVENOUS | Status: DC

## 2019-03-21 MED ORDER — LACTATED RINGERS IV SOLN: 500.0000 mL | INTRAVENOUS | Status: DC | PRN

## 2019-03-21 MED ORDER — MISOPROSTOL 200 MCG PO TABS: 800.0000 ug | ORAL_TABLET | Freq: Once | ORAL | Status: DC | PRN

## 2019-03-21 MED ORDER — DIBUCAINE (PERIANAL) 1 % EX OINT: 1.0000 "application " | TOPICAL_OINTMENT | CUTANEOUS | Status: DC | PRN

## 2019-03-21 MED ORDER — LIDOCAINE-EPINEPHRINE (PF) 2 %-1:200000 IJ SOLN
INTRAMUSCULAR | Status: DC | PRN
  Administered 2019-03-21 (×2): 2 mL via EPIDURAL

## 2019-03-21 MED ORDER — ONDANSETRON HCL 4 MG PO TABS: 4.0000 mg | ORAL_TABLET | ORAL | Status: DC | PRN

## 2019-03-21 MED ORDER — SIMETHICONE 80 MG PO CHEW: 80.0000 mg | CHEWABLE_TABLET | ORAL | Status: DC | PRN

## 2019-03-21 MED ORDER — ONDANSETRON HCL 4 MG/2ML IJ SOLN: 4.0000 mg | INTRAMUSCULAR | Status: DC | PRN

## 2019-03-21 MED ORDER — TETANUS-DIPHTH-ACELL PERTUSSIS 5-2.5-18.5 LF-MCG/0.5 IM SUSP: 0.5000 mL | Freq: Once | INTRAMUSCULAR | Status: DC

## 2019-03-21 MED ORDER — SODIUM CHLORIDE 0.9 % IV SOLN
5.0000 10*6.[IU] | Freq: Once | INTRAVENOUS | Status: AC
  Administered 2019-03-21: 5 10*6.[IU] via INTRAVENOUS
  Filled 2019-03-21: qty 5

## 2019-03-21 MED ORDER — ACETAMINOPHEN 325 MG PO TABS: 650.0000 mg | ORAL_TABLET | ORAL | Status: DC | PRN

## 2019-03-21 MED ORDER — LACTATED RINGERS IV SOLN: INTRAVENOUS | Status: DC

## 2019-03-21 MED ORDER — SOD CITRATE-CITRIC ACID 500-334 MG/5ML PO SOLN: 30.0000 mL | ORAL | Status: DC | PRN

## 2019-03-21 MED ORDER — PRENATAL MULTIVITAMIN CH
1.0000 | ORAL_TABLET | Freq: Every day | ORAL | Status: DC
  Administered 2019-03-22 – 2019-03-23 (×2): 1 via ORAL
  Filled 2019-03-21 (×2): qty 1

## 2019-03-21 MED ORDER — OXYTOCIN 40 UNITS IN NORMAL SALINE INFUSION - SIMPLE MED
1.0000 m[IU]/min | INTRAVENOUS | Status: DC
  Administered 2019-03-21: 2 m[IU]/min via INTRAVENOUS
  Filled 2019-03-21: qty 1000

## 2019-03-21 MED ORDER — OXYCODONE HCL 5 MG PO TABS: 5.0000 mg | ORAL_TABLET | ORAL | Status: DC | PRN

## 2019-03-21 MED ORDER — WITCH HAZEL-GLYCERIN EX PADS: 1.0000 "application " | MEDICATED_PAD | CUTANEOUS | Status: DC | PRN

## 2019-03-21 MED ORDER — OXYTOCIN 40 UNITS IN NORMAL SALINE INFUSION - SIMPLE MED: 2.5000 [IU]/h | INTRAVENOUS | Status: DC | PRN

## 2019-03-21 MED ORDER — PENICILLIN G POT IN DEXTROSE 60000 UNIT/ML IV SOLN
3.0000 10*6.[IU] | INTRAVENOUS | Status: DC
  Administered 2019-03-21 (×2): 3 10*6.[IU] via INTRAVENOUS
  Filled 2019-03-21 (×2): qty 50

## 2019-03-21 MED ORDER — OXYTOCIN BOLUS FROM INFUSION: 500.0000 mL | Freq: Once | INTRAVENOUS | Status: DC

## 2019-03-21 NOTE — Progress Notes (Signed)
Teresa Myers is a 29 y.o. G2P1001 at [redacted]w[redacted]d  Subjective: No complaints.  Objective: BP (!) 82/62   Pulse 72   Temp 98.3 F (36.8 C) (Oral)   Resp 16   Ht 5\' 4"  (1.626 m)   Wt 73.2 kg   LMP 06/17/2018   SpO2 100%   BMI 27.70 kg/m  No intake/output data recorded. Total I/O In: -  Out: 150 [Urine:150]  Gen:  Pt appears comfortable. Vulva:  Wet FHT:  130s, moderate varability, no decelerations. UC:   Irregular by tocometry.  None tracing on pt's left side SVE:   Dilation: 2 Effacement (%): 50 Station: -3 Exam by:: beitz, rn IUPC placed without difficulty. Thin membrane disrupted with amniohook.  Labs: Lab Results  Component Value Date   WBC 6.4 03/21/2019   HGB 11.9 (L) 03/21/2019   HCT 34.8 (L) 03/21/2019   MCV 86.1 03/21/2019   PLT 249 03/21/2019   CBG (last 3)  Recent Labs    03/21/19 0038 03/21/19 0820 03/21/19 1229  GLUCAP 106* 76 97    Assessment / Plan: IOL @ 39 4/7 weeks Gest DM, diet controlled.  CBG q 4 hours.    Blood glucose in normal range.  Labor: Inadequate contractions on 8 mUs currently.  Continue to increase Pitocin until adequate contractions. Preeclampsia:  BP normal. Fetal Wellbeing:  Category I Pain Control:  Epidural. I/D:  GBS positive.  PCN started at 0500. Anticipated MOD:  NSVD  Thurnell Lose 03/21/2019, 1:48 PM

## 2019-03-21 NOTE — Anesthesia Preprocedure Evaluation (Signed)
Anesthesia Evaluation  Patient identified by MRN, date of birth, ID band Patient awake    Reviewed: Allergy & Precautions, NPO status , Patient's Chart, lab work & pertinent test results  Airway Mallampati: II  TM Distance: >3 FB Neck ROM: Full    Dental no notable dental hx.    Pulmonary neg pulmonary ROS,    Pulmonary exam normal breath sounds clear to auscultation       Cardiovascular negative cardio ROS Normal cardiovascular exam Rhythm:Regular Rate:Normal     Neuro/Psych negative neurological ROS  negative psych ROS   GI/Hepatic negative GI ROS, Neg liver ROS,   Endo/Other  negative endocrine ROSdiabetes, Gestational  Renal/GU negative Renal ROS  negative genitourinary   Musculoskeletal negative musculoskeletal ROS (+)   Abdominal   Peds  Hematology negative hematology ROS (+)   Anesthesia Other Findings   Reproductive/Obstetrics (+) Pregnancy                             Anesthesia Physical Anesthesia Plan  ASA: III  Anesthesia Plan: Epidural   Post-op Pain Management:    Induction:   PONV Risk Score and Plan: Treatment may vary due to age or medical condition  Airway Management Planned: Natural Airway  Additional Equipment:   Intra-op Plan:   Post-operative Plan:   Informed Consent: I have reviewed the patients History and Physical, chart, labs and discussed the procedure including the risks, benefits and alternatives for the proposed anesthesia with the patient or authorized representative who has indicated his/her understanding and acceptance.       Plan Discussed with: Anesthesiologist  Anesthesia Plan Comments: (Patient identified. Risks, benefits, options discussed with patient including but not limited to bleeding, infection, nerve damage, paralysis, failed block, incomplete pain control, headache, blood pressure changes, nausea, vomiting, reactions to  medication, itching, and post partum back pain. Confirmed with bedside nurse the patient's most recent platelet count. Confirmed with the patient that they are not taking any anticoagulation, have any bleeding history or any family history of bleeding disorders. Patient expressed understanding and wishes to proceed. All questions were answered. )        Anesthesia Quick Evaluation

## 2019-03-21 NOTE — Lactation Note (Signed)
This note was copied from a baby's chart. Lactation Consultation Note  Patient Name: Teresa Myers [id number] Date: 03/21/2019 Reason for consult: Initial assessment;Term P2, 5 hour female infant. Infant has breastfeed 3 times since birth, initially  5 minutes, then 20 minutes and now 30 minutes.  Per dad, infant had 3 stools since birth, but he is  unsure about voids,  dad  was not looking at diaper indicator (blue line) to see if they were wet, he will do that going forward since he is the one changing infant's diapers.  Per mom, breastfeeding is improving and infant is starting to latch better at the breast. Mom has DEBP at home. Mom had taken infant off breast as LC  was walking  in  the room, LC did not observe latch at this time, per mom, infant breastfeed for 30 minutes. Mom is experienced at breastfeeding she breastfed her eldest son who is  almost 76 years  old for 18 months. Mom knows to breastfeed infant according to hunger cues, 8 to 12 times within 24 hours, on demand and not exceed 3 hours without breastfeeding infant. Mom knows to call RN or LC if she has any questions, concerns or need assistance with latching infant at breast.  Parents will continue to do STS as much as possible. LC discussed after hospital discharge community resources: [facility] hotline, [facility] outpatient clinic and online breastfeeding support group.     Maternal Data Formula Feeding for Exclusion: No Has patient been taught Hand Expression?: Yes(Per mom, she knows how to do hand expression.) Does the patient have breastfeeding experience prior to this delivery?: Yes  Feeding Feeding Type: Breast Fed  LATCH Score Latch: Repeated attempts needed to sustain latch, nipple held in mouth throughout feeding, stimulation needed to elicit sucking reflex.  Audible Swallowing: A few with stimulation  Type of Nipple: Everted at rest and after stimulation  Comfort (Breast/Nipple): Soft / non-tender  Hold (Positioning):  Assistance needed to correctly position infant at breast and maintain latch.  LATCH Score: 7  Interventions Interventions: Breast feeding basics reviewed;Hand express;Skin to skin  Lactation Tools Discussed/Used Tools: Coconut oil WIC Program: No   Consult Status Consult Status: Follow-up Date: 03/22/19 Follow-up type: In-patient    Vicente Serene 03/21/2019, 10:35 PM

## 2019-03-21 NOTE — Progress Notes (Signed)
Teresa Myers is a 29 y.o. G2P1001 at [redacted]w[redacted]d  Subjective: Pt with painful contractions.  Planning on epidural.  Pt heard a pop around 5 pm.  Has some leakage.  Objective: BP (!) 95/54   Pulse 83   Temp 98.2 F (36.8 C) (Oral)   Ht 5\' 4"  (1.626 m)   Wt 73.2 kg   LMP 06/17/2018   BMI 27.70 kg/m  No intake/output data recorded. No intake/output data recorded.  Gen:  Pt in moderate to severe distress with contractions. FHT:  FHR: 140s bpm, variability: moderate,  accelerations:  Present,  decelerations:  Absent UC:   Becoming regular, q 2 after exam. SVE:   Dilation: 2 Effacement (%): Thick Station: Ballotable Exam by:: Dr. Simona Myers  No membranes palpated. Labs: Lab Results  Component Value Date   WBC 6.4 03/21/2019   HGB 11.9 (L) 03/21/2019   HCT 34.8 (L) 03/21/2019   MCV 86.1 03/21/2019   PLT 249 03/21/2019    Assessment / Plan: IOL @ 39 4/7 weeks Gest DM, diet controlled.  CBG q 4 hours  Labor: I wanted to insert a foley bulb but I will defer that due to likely ROM.  Pt appears to be starting strong labor.  Observe contractions x 2 hours.  If spacing, start Pitocin. Preeclampsia:  BP normal. Fetal Wellbeing:  Category I Pain Control:  Planning epidural I/D:  GBS positive.  PCN started at 0500. Anticipated MOD:  NSVD  Thurnell Lose 03/21/2019, 8:16 AM

## 2019-03-21 NOTE — Progress Notes (Signed)
Teresa Myers is a 29 y.o. G2P1001 at [redacted]w[redacted]d IOL GDM DC Cytotec given x 2 doses, last dose at 0500    Subjective: Resting comfortably  Objective: BP 117/67   Pulse 87   Temp 98.2 F (36.8 C) (Oral)   Ht 5\' 4"  (1.626 m)   Wt 73.2 kg   LMP 06/17/2018   BMI 27.70 kg/m   FHT:  FHR: 150 bpm, variability: moderate,  accelerations:  Present,  decelerations:  Absent UC:   irregular, every 5 minutes SVE:   Dilation: 1 Effacement (%): Thick Station: Ballotable Exam by:: Janann August, RN  Labs: Lab Results  Component Value Date   WBC 6.4 03/21/2019   HGB 11.9 (L) 03/21/2019   HCT 34.8 (L) 03/21/2019   MCV 86.1 03/21/2019   PLT 249 03/21/2019    Assessment / Plan: Induction of labor due to gestational diabetes,  progressing well on pitocin  Labor: Progressing normally Preeclampsia:  NA Fetal Wellbeing:  Category I Pain Control:  Undecided at this time I/D:  GBS pos, begin treatment with SROM or active labor Anticipated MOD:  NSVD  Alonso Gapinski B Dev Dhondt 03/21/2019, 6:18 AM

## 2019-03-21 NOTE — Progress Notes (Signed)
During a brief interview, the patient reports that neither she nor her family members have a history of problems with anesthesia, including MH. Her airway is a MP 2. BMI 27. Patient has gestational diabetes, diet controlled. Pt has had an epidural with a previous delivery with good results. Any patient questions and/or concerns were addressed. She was informed that the anesthesia team is available to her 24/7 if she needs [place] or has further questions. Ivory Broad CRNA

## 2019-03-21 NOTE — Anesthesia Procedure Notes (Signed)
Epidural Patient location during procedure: OB Start time: 03/21/2019 9:33 AM End time: 03/21/2019 9:43 AM  Staffing Anesthesiologist: Freddrick March, MD Performed: anesthesiologist   Preanesthetic Checklist Completed: patient identified, IV checked, risks and benefits discussed, monitors and equipment checked, pre-op evaluation and timeout performed  Epidural Patient position: sitting Prep: DuraPrep and site prepped and draped Patient monitoring: continuous pulse ox, blood pressure, heart rate and cardiac monitor Approach: midline Location: L3-L4 Injection technique: LOR air  Needle:  Needle type: Tuohy  Needle gauge: 17 G Needle length: 9 cm Needle insertion depth: 5 cm Catheter type: closed end flexible Catheter size: 19 Gauge Catheter at skin depth: 11 cm Test dose: negative  Assessment Sensory level: T8 Events: blood not aspirated, injection not painful, no injection resistance, no paresthesia and negative IV test  Additional Notes Patient identified. Risks/Benefits/Options discussed with patient including but not limited to bleeding, infection, nerve damage, paralysis, failed block, incomplete pain control, headache, blood pressure changes, nausea, vomiting, reactions to medication both or allergic, itching and postpartum back pain. Confirmed with bedside nurse the patient's most recent platelet count. Confirmed with patient that they are not currently taking any anticoagulation, have any bleeding history or any family history of bleeding disorders. Patient expressed understanding and wished to proceed. All questions were answered. Sterile technique was used throughout the entire procedure. Please see nursing notes for vital signs. Test dose was given through epidural catheter and negative prior to continuing to dose epidural or start infusion. Warning signs of high block given to the patient including shortness of breath, tingling/numbness in hands, complete motor block, or  any concerning symptoms with instructions to call for help. Patient was given instructions on fall risk and not to get out of bed. All questions and concerns addressed with instructions to call with any issues or inadequate analgesia.  Reason for block:procedure for pain

## 2019-03-22 LAB — CBC
HCT: 34.3 % — ABNORMAL LOW (ref 36.0–46.0)
Hemoglobin: 11.6 g/dL — ABNORMAL LOW (ref 12.0–15.0)
MCH: 29.8 pg (ref 26.0–34.0)
MCHC: 33.8 g/dL (ref 30.0–36.0)
MCV: 88.2 fL (ref 80.0–100.0)
Platelets: 232 10*3/uL (ref 150–400)
RBC: 3.89 MIL/uL (ref 3.87–5.11)
RDW: 13.2 % (ref 11.5–15.5)
WBC: 9.6 10*3/uL (ref 4.0–10.5)
nRBC: 0 % (ref 0.0–0.2)

## 2019-03-22 NOTE — Lactation Note (Signed)
This note was copied from a baby's chart. Lactation Consultation Note  Patient Name: Teresa Myers [id number] Date: 03/22/2019   1337 Attempted to visit with mom, provider at bedside, [place] to return   [name] 03/22/2019, 1:38 PM

## 2019-03-22 NOTE — Lactation Note (Signed)
This note was copied from a baby's chart. Lactation Consultation Note  Patient Name: Teresa Myers [id number] Date: 03/22/2019   [id number] F/U visit with P2 mom, baby is now 61 hours old with 4% wt loss  LC entered room to find mom in bed, holding baby who is sleeping soundly. Mom c/o mild soreness and expresses concerns of baby getting enough from her breasts, questions if she should offer formula. Mom states baby has had 7 stools today but she forgets to check for the blue stripe in the diaper.  Reviewed with mom signs that baby is getting enough re: dirty diapers, wt loss WNL, satisfied after feedings. Discouraged from offering formula yet.  Reviewed alternating breasts each feeding and alternating positions.  Reviewed hand expression prior to latching; mom states she is able to obtain a "glistening." Reassured mom that she has colostrum and reviewed basics of lactogenesis.  Coconut oil at bedside, reviewed use and application and allow to air-dry.  Encouraged to call for assistance as needed. Reviewed IP lactation services.   [name] 03/22/2019, 5:43 PM

## 2019-03-22 NOTE — Progress Notes (Signed)
Postpartum day #1, NSVD  Subjective Pt without complaints.  Lochia-pt had heavy bleeding and had to change her clothes.  Bleeding has not continued.  Pain controlled.  Breast feeding yes.  After discussion of fees, couple desires inpatient circumcision.  Temp:  [97.7 F (36.5 C)-98.8 F (37.1 C)] 98.7 F (37.1 C) (03/06 0828) Pulse Rate:  [62-120] 72 (03/06 0828) Resp:  [16-18] 16 (03/06 0828) BP: (80-130)/(51-93) 107/67 (03/06 0828) SpO2:  [99 %] 99 % (03/06 0828)  Gen:  NAD, A&O x 3 Uterine fundus:  Firm, nontender Lochia normal Ext:  Minimal Edema, no calf tenderness bilaterally  CBC    Component Value Date/Time   WBC 9.6 03/22/2019 0525   RBC 3.89 03/22/2019 0525   HGB 11.6 (L) 03/22/2019 0525   HCT 34.3 (L) 03/22/2019 0525   PLT 232 03/22/2019 0525   MCV 88.2 03/22/2019 0525   MCH 29.8 03/22/2019 0525   MCHC 33.8 03/22/2019 0525   RDW 13.2 03/22/2019 0525     A/P: S/p SVD doing well. Routine postpartum care. Lactation support. Discharge in am. Circumcision today.  Thurnell Lose 03/22/2019, 1:35 PM

## 2019-03-22 NOTE — Anesthesia Postprocedure Evaluation (Signed)
Anesthesia Post Note  Patient: Teresa Myers  Procedure(s) Performed: AN AD HOC LABOR EPIDURAL     Patient location during evaluation: Mother Baby Anesthesia Type: Epidural Level of consciousness: awake and alert Pain management: pain level controlled Vital Signs Assessment: post-procedure vital signs reviewed and stable Respiratory status: spontaneous breathing, nonlabored ventilation and respiratory function stable Cardiovascular status: stable Postop Assessment: no headache, no backache and epidural receding Anesthetic complications: no Comments: Per telephone conversation    Last Vitals:  Vitals:   03/22/19 0130 03/22/19 0500  BP: 110/65 107/77  Pulse: 84 76  Resp: 16 16  Temp: 37.1 C 36.7 C  SpO2:      Last Pain:  Vitals:   03/22/19 0815  TempSrc:   PainSc: 0-No pain   Pain Goal:                   Sandrea Matte

## 2019-03-22 NOTE — Progress Notes (Signed)
RN assisted Mom to hand express small drop of colostrum.  RN recommended breast massage prior to feeding to help prime ducts, breast tissue firm in areas.  RN set [name] up with hand pump.  [name] nipples sore, RN discussed trying to achieve deeper latch.

## 2019-03-23 MED ORDER — SERTRALINE HCL 25 MG PO TABS
25.0000 mg | ORAL_TABLET | Freq: Every day | ORAL | Status: DC
  Administered 2019-03-23: 25 mg via ORAL
  Filled 2019-03-23: qty 1

## 2019-03-23 MED ORDER — SERTRALINE HCL 50 MG PO TABS: ORAL_TABLET | ORAL | 5 refills | Status: DC

## 2019-03-23 MED ORDER — IBUPROFEN 600 MG PO TABS: 600.0000 mg | ORAL_TABLET | Freq: Four times a day (QID) | ORAL | 0 refills | Status: DC

## 2019-03-23 NOTE — Progress Notes (Signed)
Postpartum day #2, NSVD  Subjective Pt without complaints.  Bleeding is minimal.  Baby had fever yesterday but is doing well and in room.  They plan to room in.  I asked pt about her h/o postpartum depression or anxiety.  Pt reports, "It got really bad."  Pt recalls 3 episodes where she heard voice encouraging her to harm her child.  One time the voice told her to put her baby's head under the water and another told her to use a knife to harm him.   When she had the thought about the knife, her baby crawled to her leg and stood and gave her a big smile.  At that point, she shouted, "I will not hurt my baby."  From that point, she considered hurting herself not the baby.  She would say,"It is better if I'm out of the picture." Pt reports it lasted for ~ 2 months.  She states she never told anyone about the voices and her husband who was in the room stated he did not know it was that bad.  Pt reports at the time, she was new to this country, she did not have any family except her husband.  In [place], [place] are provided more support which she did not have.  Husband states he works from home which he did not do last time.  He has not planned to take time off of work but was agreeable to doing so after our conversation.  Pt was agreeable to medication and therapy.  She also is agreeable to CSW consult prior to discharge.  Temp:  [98.4 F (36.9 C)] 98.4 F (36.9 C) (03/07 0504) Pulse Rate:  [79-95] 79 (03/07 0504) Resp:  [16-18] 18 (03/07 0504) BP: (93-119)/(55-84) 93/55 (03/07 0504)  Gen:  NAD, A&O x 3 Uterine fundus:  Firm, nontender Lochia normal Ext:  Minimal Edema, no calf tenderness bilaterally  CBC    Component Value Date/Time   WBC 9.6 03/22/2019 0525   RBC 3.89 03/22/2019 0525   HGB 11.6 (L) 03/22/2019 0525   HCT 34.3 (L) 03/22/2019 0525   PLT 232 03/22/2019 0525   MCV 88.2 03/22/2019 0525   MCH 29.8 03/22/2019 0525   MCHC 33.8 03/22/2019 0525   RDW 13.2 03/22/2019 0525      A/P: S/p SVD doing well. Discharge today.  Baby rooming in. H/o postpartum depression, severe.    Consult social work.  I spoke with Benita who states will see the pt.    Start Zoloft.  First dose now. Circumcision held due to fever of baby.    D/w peds to see when circumcision can be done.  Thurnell Lose 03/23/2019, 12:19 PM

## 2019-03-23 NOTE — Progress Notes (Signed)
CSW acknowledges consult.  CSW attempted to meet with [name], however FOB was in room sleeping at time of visit. [name] requested CSW return later.  CSW will attempt to visit with [name] at a later time.   Teresa Myers D. Lissa Morales, MSW, [facility] Clinical Social Worker [phone]

## 2019-03-23 NOTE — Progress Notes (Signed)
CSW received consult for hx of postpartum depression.  CSW met with [name] to offer support and complete assessment.    CSW met with [name] at bedside. Infant Teresa Myers and FOB were present at time of arrival, however FOB left room to offer [name] privacy during assessment. Infant remained in room with [name]. [name] was pleasant, insightful, and engaged throughout assessment.   [name] reported no prior mental health concerns until after the birth of first son. [name] reported following his birth she started having concerns with auditory hallucination which instructed her to harm her son. [name] reported she never gave into voices. [name] reported once son was 9 months she hollered at the voices and told them she would take her life before harming baby. [name] reported at that point the voices started instructing her to harm herself and frequently told her she was a bad mother. [name] stated this continued until baby was around 18 months and she started a new job.  [name] identified several stressors during the mentioned time period; new to the country after moving from [place], she was a new mother, husband worked out of the home, very ill mother-in-law was living in the home and [name] was the primary caregiver, and they were having financial concerns. [name] stated " I was alone, exhausted, overworked, and overwhelmed." [name] stated she never shared what she was going through with anyone. FOB did not know all details until today when  [name] and FOB had conversation with Teresa Varnado, MD. As a result of conversation,  MD requested CSW consult and prescribed Zoloft 25 mg. [name] had first dose today and plans to continue as a preventative measure. [name] also stated previous stressors are not a concern anymore. FOB works from home and is very helpful. [name] also has several friends of whom she is comfortable talking to and asking for help. CSW normalized postpartum sx and concerns and celebrated her decision to seek help before going home with infant. CSW  encouraged [name] to continue to stating needs and concerns to FOB, pediatrician, and/or OBGYN until she decides to connect with behavioral health therapist. [name] denied any current concerns with SI, HI, or domestic violence. [name] identified current mood as "I feel good".   CSW had FOB return to the room to join [name] as CSW provided education regarding the baby blues period vs. perinatal mood disorders, discussed treatment and gave resources for mental health follow up if concerns arise.  CSW recommends self-evaluation during the postpartum time period using the New Mom Checklist from Postpartum Progress and encouraged [name] to contact a medical professional if symptoms are noted at any time. FOB and [name] asked appropriate questions and stated understanding. FOB informed CSW of conversation he had with [name] earlier today. FOB reminded [name] that they are a team and she can always let him know how she is feeling so he can assist. [name] agreed to keep the lines of communication open between them and not be ashamed of how she feels or what she needs.  [name] and FOB were provided emergency numbers in the case sx return.   CSW provided review of Sudden Infant Death Syndrome (SIDS) precautions. [name] confirmed having all needed items for baby including car seat and bassinet. [name] identified bassinet as safe sleeping area.      CSW identifies no further need for intervention and no barriers to discharge at this time.   D. , MSW, LCSWA Clinical Social Worker [phone] 

## 2019-03-23 NOTE — Lactation Note (Signed)
This note was copied from a baby's chart. Lactation Consultation Note  Patient Name: Boy Jenaya Eastmond [id number] Date: 03/23/2019 Reason for consult: Follow-up assessment;Term;Infant weight loss;Other (Comment)(8 % weight loss/ milk coming in) Baby is 36 hours old / 36 hours - 6.0 Bili check  As LC entered the room baby asleep on mom chest STS.  Per mom the baby last fed at 1 pm for 60 mins with multiple swallows.  Per mom comfortable feeding.  LC discussed 8 % weight loss, LC recommended applying moist heat  , breast massage, hand express, prepump with hand pump, Latch with compressions and intermittently.  Hand express between feedings and spoon feed back to baby . If the baby gets sluggish tonight the RN needs to set up a DEBP for post pumping.  Mom expressed she wanted to do the 1 st [facility] plan option 1st .  Mom receptive to being set up with a DEBP if needed.  LC explained plan to the [place].      Maternal Data Has patient been taught Hand Expression?: Yes  Feeding Feeding Type: Breast Fed  LATCH Score                   Interventions Interventions: Breast feeding basics reviewed  Lactation Tools Discussed/Used Pump Review: Milk Storage   Consult Status Consult Status: Follow-up Date: 03/24/19 Follow-up type: In-patient    [place] 03/23/2019, 3:15 PM

## 2019-03-23 NOTE — Progress Notes (Signed)
I spoke with Dr. Abner Greenspan and she is ok with circumcision after 24 hours afebrile, which will be ~ 2:30 pm today.  FOB was informed I will try to do later this evening.

## 2019-03-23 NOTE — Discharge Instructions (Addendum)
Postpartum Care After Vaginal Delivery °This sheet gives you information about how to care for yourself from the time you deliver your baby to up to 6-12 weeks after delivery (postpartum period). Your health care provider may also give you more specific instructions. If you have problems or questions, contact your health care provider. °Follow these instructions at home: °Vaginal bleeding °· It is normal to have vaginal bleeding (lochia) after delivery. Wear a sanitary pad for vaginal bleeding and discharge. °? During the first week after delivery, the amount and appearance of lochia is often similar to a menstrual period. °? Over the next few weeks, it will gradually decrease to a dry, yellow-brown discharge. °? For most women, lochia stops completely by 4-6 weeks after delivery. Vaginal bleeding can vary from woman to woman. °· Change your sanitary pads frequently. Watch for any changes in your flow, such as: °? A sudden increase in volume. °? A change in color. °? Large blood clots. °· If you pass a blood clot from your vagina, save it and call your health care provider to discuss. Do not flush blood clots down the toilet before talking with your health care provider. °· Do not use tampons or douches until your health care provider says this is safe. °· If you are not breastfeeding, your period should return 6-8 weeks after delivery. If you are feeding your child breast milk only (exclusive breastfeeding), your period may not return until you stop breastfeeding. °Perineal care °· Keep the area between the vagina and the anus (perineum) clean and dry as told by your health care provider. Use medicated pads and pain-relieving sprays and creams as directed. °· If you had a cut in the perineum (episiotomy) or a tear in the vagina, check the area for signs of infection until you are healed. Check for: °? More redness, swelling, or pain. °? Fluid or blood coming from the cut or tear. °? Warmth. °? Pus or a bad  smell. °· You may be given a squirt bottle to use instead of wiping to clean the perineum area after you go to the bathroom. As you start healing, you may use the squirt bottle before wiping yourself. Make sure to wipe gently. °· To relieve pain caused by an episiotomy, a tear in the vagina, or swollen veins in the anus (hemorrhoids), try taking a warm sitz bath 2-3 times a day. A sitz bath is a warm water bath that is taken while you are sitting down. The water should only come up to your hips and should cover your buttocks. °Breast care °· Within the first few days after delivery, your breasts may feel heavy, full, and uncomfortable (breast engorgement). Milk may also leak from your breasts. Your health care provider can suggest ways to help relieve the discomfort. Breast engorgement should go away within a few days. °· If you are breastfeeding: °? Wear a bra that supports your breasts and fits you well. °? Keep your nipples clean and dry. Apply creams and ointments as told by your health care provider. °? You may need to use breast pads to absorb milk that leaks from your breasts. °? You may have uterine contractions every time you breastfeed for up to several weeks after delivery. Uterine contractions help your uterus return to its normal size. °? If you have any problems with breastfeeding, work with your health care provider or lactation consultant. °· If you are not breastfeeding: °? Avoid touching your breasts a lot. Doing this can make   your breasts produce more milk. °? Wear a good-fitting bra and use cold packs to help with swelling. °? Do not squeeze out (express) milk. This causes you to make more milk. °Intimacy and sexuality °· Ask your health care provider when you can engage in sexual activity. This may depend on: °? Your risk of infection. °? How fast you are healing. °? Your comfort and desire to engage in sexual activity. °· You are able to get pregnant after delivery, even if you have not had  your period. If desired, talk with your health care provider about methods of birth control (contraception). °Medicines °· Take over-the-counter and prescription medicines only as told by your health care provider. °· If you were prescribed an antibiotic medicine, take it as told by your health care provider. Do not stop taking the antibiotic even if you start to feel better. °Activity °· Gradually return to your normal activities as told by your health care provider. Ask your health care provider what activities are safe for you. °· Rest as much as possible. Try to rest or take a nap while your baby is sleeping. °Eating and drinking ° °· Drink enough fluid to keep your urine pale yellow. °· Eat high-fiber foods every day. These may help prevent or relieve constipation. High-fiber foods include: °? Whole grain cereals and breads. °? Brown rice. °? Beans. °? Fresh fruits and vegetables. °· Do not try to lose weight quickly by cutting back on calories. °· Take your prenatal vitamins until your postpartum checkup or until your health care provider tells you it is okay to stop. °Lifestyle °· Do not use any products that contain nicotine or tobacco, such as cigarettes and e-cigarettes. If you need help quitting, ask your health care provider. °· Do not drink alcohol, especially if you are breastfeeding. °General instructions °· Keep all follow-up visits for you and your baby as told by your health care provider. Most women visit their health care provider for a postpartum checkup within the first 3-6 weeks after delivery. °Contact a health care provider if: °· You feel unable to cope with the changes that your child brings to your life, and these feelings do not go away. °· You feel unusually sad or worried. °· Your breasts become red, painful, or hard. °· You have a fever. °· You have trouble holding urine or keeping urine from leaking. °· You have little or no interest in activities you used to enjoy. °· You have not  breastfed at all and you have not had a menstrual period for 12 weeks after delivery. °· You have stopped breastfeeding and you have not had a menstrual period for 12 weeks after you stopped breastfeeding. °· You have questions about caring for yourself or your baby. °· You pass a blood clot from your vagina. °Get help right away if: °· You have chest pain. °· You have difficulty breathing. °· You have sudden, severe leg pain. °· You have severe pain or cramping in your lower abdomen. °· You bleed from your vagina so much that you fill more than one sanitary pad in one hour. Bleeding should not be heavier than your heaviest period. °· You develop a severe headache. °· You faint. °· You have blurred vision or spots in your vision. °· You have bad-smelling vaginal discharge. °· You have thoughts about hurting yourself or your baby. °If you ever feel like you may hurt yourself or others, or have thoughts about taking your own life, get help   right away. You can go to the nearest emergency department or call:  Your local emergency services (911 in the [place]  A suicide crisis helpline, such as the National Suicide Prevention Lifeline at 1-800-273-8255. This is open 24 hours a day. Summary  The period of time right after you deliver your newborn up to 6-12 weeks after delivery is called the postpartum period.  Gradually return to your normal activities as told by your health care provider.  Keep all follow-up visits for you and your baby as told by your health care provider. This information is not intended to replace advice given to you by your health care provider. Make sure you discuss any questions you have with your health care provider. Document Revised: 01/05/2017 Document Reviewed: 10/16/2016 Elsevier Patient Education  2020 [employer]  Postpartum Baby Blues The postpartum period begins right after the birth of a baby. During this time, there is often a lot of joy and excitement. It is also a  time of many changes in the life of the parents. No matter how many times a mother gives birth, each child brings new challenges to the family, including different ways of relating to one another. It is common to have feelings of excitement along with confusing changes in moods, emotions, and thoughts. You may feel happy one minute and sad or stressed the next. These feelings of sadness usually happen in the period right after you have your baby, and they go away within a week or two. This is called the "baby blues." What are the causes? There is no known cause of baby blues. It is likely caused by a combination of factors. However, changes in hormone levels after childbirth are believed to trigger some of the symptoms. Other factors that can play a role in these mood changes include:  Lack of sleep.  Stressful life events, such as poverty, caring for a loved one, or death of a loved one.  Genetics. What are the signs or symptoms? Symptoms of this condition include:  Brief changes in mood, such as going from extreme happiness to sadness.  Decreased concentration.  Difficulty sleeping.  Crying spells and tearfulness.  Loss of appetite.  Irritability.  Anxiety. If the symptoms of baby blues last for more than 2 weeks or become more severe, you may have postpartum depression. How is this diagnosed? This condition is diagnosed based on an evaluation of your symptoms. There are no medical or lab tests that lead to a diagnosis, but there are various questionnaires that a health care provider may use to identify women with the baby blues or postpartum depression. How is this treated? Treatment is not needed for this condition. The baby blues usually go away on their own in 1-2 weeks. Social support is often all that is needed. You will be encouraged to get adequate sleep and rest. Follow these instructions at home: Lifestyle      Get as much rest as you can. Take a nap when the baby  sleeps.  Exercise regularly as told by your health care provider. Some women find yoga and walking to be helpful.  Eat a balanced and nourishing diet. This includes plenty of fruits and vegetables, whole grains, and lean proteins.  Do little things that you enjoy. Have a cup of tea, take a bubble bath, read your favorite magazine, or listen to your favorite music.  Avoid alcohol.  Ask for help with household chores, cooking, grocery shopping, or running errands. Do not try to   do everything yourself. Consider hiring a postpartum doula to help. This is a professional who specializes in providing support to new mothers.  Try not to make any major life changes during pregnancy or right after giving birth. This can add stress. General instructions  Talk to people close to you about how you are feeling. Get support from your partner, family members, friends, or other new moms. You may want to join a support group.  Find ways to cope with stress. This may include: ? Writing your thoughts and feelings in a journal. ? Spending time outside. ? Spending time with people who make you laugh.  Try to stay positive in how you think. Think about the things you are grateful for.  Take over-the-counter and prescription medicines only as told by your health care provider.  Let your health care provider know if you have any concerns.  Keep all postpartum visits as told by your health care provider. This is important. Contact a health care provider if:  Your baby blues do not go away after 2 weeks. Get help right away if:  You have thoughts of taking your own life (suicidal thoughts).  You think you may harm the baby or other people.  You see or hear things that are not there (hallucinations). Summary  After giving birth, you may feel happy one minute and sad or stressed the next. Feelings of sadness that happen right after the baby is born and go away after a week or two are called the "baby  blues."  You can manage the baby blues by getting enough rest, eating a healthy diet, exercising, spending time with supportive people, and finding ways to cope with stress.  If feelings of sadness and stress last longer than 2 weeks or get in the way of caring for your baby, talk to your health care provider. This may mean you have postpartum depression. This information is not intended to replace advice given to you by your health care provider. Make sure you discuss any questions you have with your health care provider. Document Revised: 04/26/2018 Document Reviewed: 02/29/2016 Elsevier Patient Education  2020 Elsevier Inc.  

## 2019-03-23 NOTE — Discharge Summary (Signed)
OB Discharge Summary     Patient Name: Teresa Myers DOB: 07-13-1990 MRN: [id number]  Date of admission: 03/21/2019 Delivering MD: Thurnell Lose   Date of discharge: 03/23/2019  Admitting diagnosis: Encounter for induction of labor [Z34.90] Intrauterine pregnancy: [redacted]w[redacted]d     Secondary diagnosis:  Active Problems:   Encounter for induction of labor  Additional problems: Gestational Diabetes, Diet controlled     Discharge diagnosis: Term Pregnancy Delivered, GDM A1 and H/o postpartum depression-untreated with last pregnancy                                                                                                Post partum procedures:None  Augmentation: AROM, Pitocin and Cytotec  Complications: None  Hospital course:  Induction of Labor With Vaginal Delivery   29 y.o. yo [id number] at [redacted]w[redacted]d was admitted to the hospital 03/21/2019 for induction of labor.  Indication for induction: A1 DM.  Patient had an uncomplicated labor course as follows: Membrane Rupture Time/Date: 5:00 AM ,03/21/2019   Intrapartum Procedures: Episiotomy: None [1]                                         Lacerations:  1st degree [2];Perineal [11]  Patient had delivery of a Viable infant.  Information for the patient's newborn:  Alonah, Dlugos [id number]      03/21/2019  Details of delivery can be found in separate delivery note.  Patient had a routine postpartum course. Patient is discharged home 03/23/19.  Physical exam  Vitals:   03/22/19 1700 03/22/19 2210 03/23/19 0504 03/23/19 1615  BP: 119/66 116/84 (!) 93/55 112/60  Pulse: 81 95 79 82  Resp: 16 16 18 18   Temp:  98.4 F (36.9 C) 98.4 F (36.9 C) 98 F (36.7 C)  TempSrc:  Oral Oral   SpO2:    100%  Weight:      Height:       General: alert, cooperative and no distress Lochia: appropriate Uterine Fundus: firm Incision: N/A DVT Evaluation: No evidence of DVT seen on physical exam. No cords or calf tenderness. Labs: Lab Results   Component Value Date   WBC 9.6 03/22/2019   HGB 11.6 (L) 03/22/2019   HCT 34.3 (L) 03/22/2019   MCV 88.2 03/22/2019   PLT 232 03/22/2019   CMP Latest Ref Rng & Units 07/04/2015  Glucose 65 - 99 mg/dL 70  BUN 6 - 20 mg/dL 15  Creatinine 0.44 - 1.00 mg/dL 0.87  Sodium 135 - 145 mmol/L 137  Potassium 3.5 - 5.1 mmol/L 3.5  Chloride 101 - 111 mmol/L 108  CO2 22 - 32 mmol/L 24  Calcium 8.9 - 10.3 mg/dL 8.5(L)  Total Protein 6.5 - 8.1 g/dL 5.4(L)  Total Bilirubin 0.3 - 1.2 mg/dL 0.5  Alkaline Phos 38 - 126 U/L 152(H)  AST 15 - 41 U/L 45(H)  ALT 14 - 54 U/L 16    Discharge instruction: per After Visit Summary and "Baby and Me Booklet".  After  visit meds:  Allergies as of 03/23/2019      Reactions   Other Dermatitis   Dairy causes eczema      Medication List    TAKE these medications   ibuprofen 600 MG tablet Commonly known as: ADVIL Take 1 tablet (600 mg total) by mouth every 6 (six) hours.   prenatal multivitamin Tabs tablet Take 1 tablet by mouth at bedtime.   sertraline 50 MG tablet Commonly known as: Zoloft 1/2 tablet x 2 doses then one tablet daily.       Diet: routine diet  Activity: Advance as tolerated. Pelvic rest for 6 weeks.   Outpatient follow up:2 weeks Follow up Appt:No future appointments. Follow up Visit:No follow-ups on file.  Postpartum contraception: Undecided  Newborn Data: Live born female  Birth Weight: 7 lb 13 oz (3544 g) APGAR: 68, 9  Newborn Delivery   Birth date/time: 03/21/2019 17:15:00 Delivery type: Vaginal, Spontaneous      Baby Feeding: Breast Disposition:rooming in   03/23/2019 Thurnell Lose, MD

## 2019-03-24 ENCOUNTER — Ambulatory Visit: Payer: Self-pay

## 2019-03-24 NOTE — Lactation Note (Signed)
This note was copied from a baby's chart. Lactation Consultation Note  Patient Name: Teresa Myers [id number] Date: 03/24/2019 Reason for consult: Follow-up assessment;Term;Infant weight loss;Other (Comment) Baby is 85 hours old. Lost 1 oz from yesterday and milk is in per mom and LC assessment with assisting with latch.  Baby fussy before and after Dr. Jasmine December and ready to feed. LC assisted mom to latch  In the cross cradle right breast. LC noted the breast to be full with nodules on the  Right lateral aspect of the breast. Baby opened wide and latched with depth, increased swallows with compressions. Baby fed for 15 mins and the breast softened. Baby satisfied and asleep after feeding.  Sore nipple and engorgement prevention and tx reviewed. LC provided and instructed mom on the use of shells between feedings except when sleeping as a preventive measure and already has a hand pump . #27 F provided if needed is engorged . Storage of breast milk reviewed.  Per mom has a DEBP at home.    Maternal Data    Feeding Feeding Type: Breast Fed  LATCH Score Latch: Grasps breast easily, tongue down, lips flanged, rhythmical sucking.  Audible Swallowing: Spontaneous and intermittent  Type of Nipple: Everted at rest and after stimulation  Comfort (Breast/Nipple): Filling, red/small blisters or bruises, mild/mod discomfort  Hold (Positioning): Assistance needed to correctly position infant at breast and maintain latch.  LATCH Score: 8  Interventions Interventions: Breast feeding basics reviewed;Assisted with latch;Skin to skin;Breast massage;Breast compression;Adjust position;Support pillows;Position options  Lactation Tools Discussed/Used     Consult Status Consult Status: Follow-up Date: 03/24/19    Myer Haff 03/24/2019, 8:28 AM

## 2020-12-14 ENCOUNTER — Ambulatory Visit (INDEPENDENT_AMBULATORY_CARE_PROVIDER_SITE_OTHER): Payer: BC Managed Care – PPO | Admitting: Nurse Practitioner

## 2020-12-14 ENCOUNTER — Encounter: Payer: Self-pay | Admitting: Nurse Practitioner

## 2020-12-14 ENCOUNTER — Telehealth: Payer: Self-pay | Admitting: Nurse Practitioner

## 2020-12-14 ENCOUNTER — Other Ambulatory Visit (HOSPITAL_COMMUNITY)
Admission: RE | Admit: 2020-12-14 | Discharge: 2020-12-14 | Disposition: A | Discharge status: Home / Self Care | Payer: BC Managed Care – PPO | Source: Ambulatory Visit | Attending: Nurse Practitioner | Admitting: Nurse Practitioner

## 2020-12-14 ENCOUNTER — Other Ambulatory Visit: Payer: Self-pay

## 2020-12-14 VITALS — BP 100/60 | HR 86 | Temp 97.6°F | Ht 64.0 in | Wt 116.6 lb

## 2020-12-14 DIAGNOSIS — Z13220 Encounter for screening for lipoid disorders: Secondary | ICD-10-CM

## 2020-12-14 DIAGNOSIS — N6322 Unspecified lump in the left breast, upper inner quadrant: Secondary | ICD-10-CM | POA: Diagnosis not present

## 2020-12-14 DIAGNOSIS — Z113 Encounter for screening for infections with a predominantly sexual mode of transmission: Secondary | ICD-10-CM

## 2020-12-14 DIAGNOSIS — K5909 Other constipation: Secondary | ICD-10-CM

## 2020-12-14 DIAGNOSIS — Z23 Encounter for immunization: Secondary | ICD-10-CM | POA: Diagnosis not present

## 2020-12-14 DIAGNOSIS — N760 Acute vaginitis: Secondary | ICD-10-CM | POA: Diagnosis not present

## 2020-12-14 DIAGNOSIS — Z136 Encounter for screening for cardiovascular disorders: Secondary | ICD-10-CM

## 2020-12-14 DIAGNOSIS — Z0001 Encounter for general adult medical examination with abnormal findings: Secondary | ICD-10-CM | POA: Diagnosis not present

## 2020-12-14 DIAGNOSIS — N898 Other specified noninflammatory disorders of vagina: Secondary | ICD-10-CM

## 2020-12-14 MED ORDER — NYSTATIN 100000 UNIT/GM EX CREA: 1.0000 "application " | TOPICAL_CREAM | Freq: Two times a day (BID) | CUTANEOUS | 0 refills | Status: DC

## 2020-12-14 NOTE — Patient Instructions (Addendum)
Thank you for choosing [facility]  Schedule lab appt for fasting blood draw. You will need to be fasting 8hrs prior to blood draw. Ok to drink water.  You will be contacted to schedule appt with breast center. Start dietary fiber supplement: metamucil or benefiber 1tsp BID and adequate daily oral hydration  GYN in the area:[facility] [place] [facility] [employer], [facility] for [place] and [place].  Dermatology in area: [facility] dermatology, [facility] dermatology, [facility]. Constipation, Adult Constipation is when a person has fewer than three bowel movements in a week, has difficulty having a bowel movement, or has stools (feces) that are dry, hard, or larger than normal. Constipation may be caused by an underlying condition. It may become worse with age if a person takes certain medicines and does not take in enough fluids. Follow these instructions at home: Eating and drinking  Eat foods that have a lot of fiber, such as beans, whole grains, and fresh fruits and vegetables. Limit foods that are low in fiber and high in fat and processed sugars, such as fried or sweet foods. These include french fries, hamburgers, cookies, candies, and soda. Drink enough fluid to keep your urine pale yellow. General instructions Exercise regularly or as told by your health care provider. Try to do 150 minutes of moderate exercise each week. Use the bathroom when you have the urge to go. Do not hold it in. Take over-the-counter and prescription medicines only as told by your health care provider. This includes any fiber supplements. During bowel movements: Practice deep breathing while relaxing the lower abdomen. Practice pelvic floor relaxation. Watch your condition for any changes. Let your health care provider know about them. Keep all follow-up visits as told by your health care provider. This is  important. Contact a health care provider if: You have pain that gets worse. You have a fever. You do not have a bowel movement after 4 days. You vomit. You are not hungry or you lose weight. You are bleeding from the opening between the buttocks (anus). You have thin, pencil-like stools. Get help right away if: You have a fever and your symptoms suddenly get worse. You leak stool or have blood in your stool. Your abdomen is bloated. You have severe pain in your abdomen. You feel dizzy or you faint. Summary Constipation is when a person has fewer than three bowel movements in a week, has difficulty having a bowel movement, or has stools (feces) that are dry, hard, or larger than normal. Eat foods that have a lot of fiber, such as beans, whole grains, and fresh fruits and vegetables. Drink enough fluid to keep your urine pale yellow. Take over-the-counter and prescription medicines only as told by your health care provider. This includes any fiber supplements. This information is not intended to replace advice given to you by your health care provider. Make sure you discuss any questions you have with your health care provider. Document Revised: 11/20/2018 Document Reviewed: 11/20/2018 Elsevier Patient Education  [place].

## 2020-12-14 NOTE — Progress Notes (Signed)
Subjective:    Patient ID: Teresa Myers, female    DOB: [birth year], 30 y.o.   MRN: [id number]  Patient presents today for CPE and eval of acute complaint  Vaginal Itching The patient's primary symptoms include genital itching, genital lesions and vaginal discharge. The patient's pertinent negatives include no genital odor, genital rash, missed menses, pelvic pain or vaginal bleeding. This is a new problem. The current episode started 1 to 4 weeks ago. The problem occurs constantly. The problem has been unchanged. The patient is experiencing no pain. The problem affects both sides. She is not pregnant. Associated symptoms include constipation. Pertinent negatives include no abdominal pain, anorexia, back pain, chills, diarrhea, discolored urine, dysuria, fever, flank pain, frequency, headaches, joint pain, nausea, painful intercourse, rash, sore throat, urgency or vomiting. The vaginal discharge was milky. The vaginal bleeding is typical of menses. She has not been passing clots. She has not been passing tissue. Nothing aggravates the symptoms. She has tried nothing for the symptoms. She is sexually active. It is unknown whether or not her partner has an STD. She uses the rhythm method for contraception. Her menstrual history has been irregular. There is no history of an abdominal surgery, endometriosis, herpes simplex, PID, an STD, a terminated pregnancy or vaginosis.  Constipation This is a chronic problem. The current episode started more than 1 year ago. The problem is unchanged. Her stool frequency is 1 time per week or less. The stool is described as pellet like and firm. The patient is on a high fiber diet. She Does not exercise regularly. There has Not been adequate water intake. Pertinent negatives include no abdominal pain, anorexia, back pain, bloating, diarrhea, difficulty urinating, fecal incontinence, fever, flatus, hematochezia, hemorrhoids, melena, nausea, rectal pain, vomiting or weight  loss. She has tried fiber for the symptoms. The treatment provided significant relief. There is no history of abdominal surgery, endocrine disease, inflammatory bowel disease, irritable bowel syndrome, metabolic disease, neurologic disease, neuromuscular disease, psychiatric history or radiation treatment.   Vision:will schedule Dental:will schedule Diet:regular Exercise:none due to schedule and home duties Weight:  Wt Readings from Last 3 Encounters:  12/14/20 116 lb 9.6 oz (52.9 kg)  03/21/19 161 lb 6.4 oz (73.2 kg)  06/30/15 160 lb (72.6 kg)    Sexual History (orientation,birth control, marital status, STD):married, sexually active, requesting for STD screen, last PAP 2020 (normal),  Depression/Suicide: Depression screen [facility] 2/9 01/09/2019 02/18/2015  Decreased Interest 0 0  Down, Depressed, Hopeless 0 0  PHQ - 2 Score 0 0   Immunizations: (TDAP, Hep C screen, Pneumovax, Influenza, zoster)  Health Maintenance  Topic Date Due   Hepatitis C Screening: USPSTF Recommendation to screen - Ages 69-79 yo.  Never done   Pneumococcal Vaccination (1 - PCV) 12/14/2021*   Pap Smear  09/01/2021   Tetanus Vaccine  04/01/2025   Flu Shot  Completed   HIV Screening  Completed   HPV Vaccine  Aged Out  *Topic was postponed. The date shown is not the original due date.   Fall Risk: Fall Risk  12/14/2020 01/09/2019 02/18/2015  Falls in the past year? 0 0 No  Number falls in past yr: 0 - -  Injury with Fall? 0 - -  Risk for fall due to : No Fall Risks - -  Follow up Falls evaluation completed - -   Medications and allergies reviewed with patient and updated if appropriate.  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   No current  facility-administered medications on file prior to visit.    Past Medical History:  Diagnosis Date   Gestational diabetes    History of gestational hypertension    Hx of gonorrhea    Medical history non-contributory    Vaginal Pap smear, abnormal    HPV 2019    Past Surgical History:  Procedure Laterality Date   VAGINA SURGERY  2011   laser surgery    Social History   Socioeconomic History   Marital status: Married    Spouse name: Not on file   Number of children: Not on file   Years of education: Not on file   Highest education level: Not on file  Occupational History   Not on file  Tobacco Use   Smoking status: Never   Smokeless tobacco: Never  Substance and Sexual Activity   Alcohol use: No   Drug use: No   Sexual activity: Yes    Birth control/protection: Other-see comments    Comment: undecided, desires more education  Other Topics Concern   Not on file  Social History Narrative   Not on file   Social Determinants of Health   Financial Resource Strain: Not on file  Food Insecurity: Not on file  Transportation Needs: Not on file  Physical Activity: Not on file  Stress: Not on file  Social Connections: Not on file   Family History  Problem Relation Age of Onset   Diabetes Father    Cancer Neg Hx    Hypertension Neg Hx        Review of Systems  Constitutional:  Negative for chills, fever, malaise/fatigue and weight loss.  HENT:  Negative for congestion and sore throat.   Eyes:        Negative for visual changes  Respiratory:  Negative for cough and shortness of breath.   Cardiovascular:  Negative for chest pain, palpitations and leg swelling.  Gastrointestinal:  Positive for constipation. Negative for abdominal pain, anorexia, bloating, blood in stool, diarrhea, flatus, heartburn, hematochezia, hemorrhoids, melena, nausea, rectal pain and vomiting.  Genitourinary:  Positive for vaginal discharge. Negative for difficulty urinating, dysuria, flank  pain, frequency, missed menses, pelvic pain and urgency.  Musculoskeletal:  Negative for back pain, falls, joint pain and myalgias.  Skin:  Negative for rash.  Neurological:  Negative for dizziness, sensory change and headaches.  Endo/Heme/Allergies:  Does not bruise/bleed easily.  Psychiatric/Behavioral:  Negative for depression, substance abuse and suicidal ideas. The patient is not nervous/anxious.    Objective:   Vitals:   12/14/20 1303  BP: 100/60  Pulse: 86  Temp: 97.6 F (36.4 C)  SpO2: 98%    Body mass index is 20.01 kg/m.  Physical Examination:  Physical Exam Vitals reviewed. Exam conducted with a chaperone present.  HENT:     Right Ear: Tympanic membrane, ear canal and external ear normal.     Left Ear: Tympanic membrane, ear canal and external ear normal.  Eyes:     Extraocular Movements: Extraocular movements intact.     Conjunctiva/sclera: Conjunctivae normal.  Cardiovascular:     Rate and Rhythm: Normal rate and regular rhythm.     Pulses: Normal pulses.     Heart sounds: Normal heart sounds.  Pulmonary:  Effort: Pulmonary effort is normal.     Breath sounds: Normal breath sounds.  Chest:  Breasts:    Right: Normal.     Left: Mass present. No swelling, bleeding, inverted nipple, nipple discharge, skin change or tenderness.    Abdominal:     General: Bowel sounds are normal.     Palpations: Abdomen is soft.     Hernia: There is no hernia in the left inguinal area or right inguinal area.  Genitourinary:    Labia:        Right: Rash present. No tenderness, lesion or injury.        Left: Rash present. No tenderness or lesion.      Urethra: Prolapse present.       Comments: Excoriation and fissure in perineal region Musculoskeletal:     Cervical back: Normal range of motion and neck supple.     Right lower leg: No edema.     Left lower leg: No edema.  Lymphadenopathy:     Cervical: No cervical adenopathy.     Upper Body:     Right upper body:  No supraclavicular, axillary or pectoral adenopathy.     Left upper body: No supraclavicular, axillary or pectoral adenopathy.     Lower Body: No right inguinal adenopathy. No left inguinal adenopathy.  Skin:    General: Skin is warm and dry.  Neurological:     Mental Status: She is alert and oriented to person, place, and time.  Psychiatric:        Mood and Affect: Mood normal.        Behavior: Behavior normal.        Thought Content: Thought content normal.   ASSESSMENT and PLAN: This visit occurred during the SARS-CoV-2 public health emergency.  Safety protocols were in place, including screening questions prior to the visit, additional usage of staff PPE, and extensive cleaning of exam room while observing appropriate contact time as indicated for disinfecting solutions.   Merilynn was seen today for establish care.  Diagnoses and all orders for this visit:  Encounter for preventative adult health care exam with abnormal findings -     Cancel: CBC with Differential/Platelet -     Cancel: Comprehensive metabolic panel -     Cancel: TSH -     Cancel: Lipid panel -     TSH; Future -     Comprehensive metabolic panel; Future -     CBC with Differential/Platelet; Future -     Lipid panel; Future  Flu vaccine need -     Flu Vaccine QUAD 6+ mos PF IM (Fluarix Quad PF)  Encounter for lipid screening for cardiovascular disease -     Cancel: Lipid panel -     Lipid panel; Future  Screen for STD (sexually transmitted disease) -     Cancel: HIV Antibody (routine testing w rflx) -     RPR -     Cervicovaginal ancillary only -     Cancel: Hepatitis C antibody -     HIV Antibody (routine testing w rflx); Future -     Hepatitis C antibody; Future  Chronic constipation  Mass of upper inner quadrant of left breast -     MM Digital Diagnostic Unilat L; Future -     MM Digital Diagnostic Bilat Ltd; Future -     US BREAST COMPLETE UNI LEFT INC AXILLA; Future  Vaginal discharge -      Cervicovaginal ancillary only -  nystatin cream (MYCOSTATIN); Apply 1 application topically 2 (two) times daily.    Schedule appt for annual eye and dental exam. Schedule lab appt for fasting blood draw. You will need to be fasting 8hrs prior to blood draw. Ok to drink water. You will be contacted to schedule appt with breast center. Start dietary fiber supplement: metamucil or benefiber 1tsp BID and adequate daily oral hydration.  Problem List Items Addressed This Visit   None Visit Diagnoses     Encounter for preventative adult health care exam with abnormal findings    -  Primary   Relevant Orders   TSH   Comprehensive metabolic panel   CBC with Differential/Platelet   Lipid panel   Flu vaccine need       Relevant Orders   Flu Vaccine QUAD 6+ mos PF IM (Fluarix Quad PF) (Completed)   Encounter for lipid screening for cardiovascular disease       Relevant Orders   Lipid panel   Screen for STD (sexually transmitted disease)       Relevant Orders   RPR   Cervicovaginal ancillary only   HIV Antibody (routine testing w rflx)   Hepatitis C antibody   Chronic constipation       Mass of upper inner quadrant of left breast       Relevant Orders   MM Digital Diagnostic Unilat L   MM Digital Diagnostic Bilat Ltd   US BREAST COMPLETE UNI LEFT INC AXILLA   Vaginal discharge       Relevant Medications   nystatin cream (MYCOSTATIN)   Other Relevant Orders   Cervicovaginal ancillary only       Follow up: Return in about 1 year (around 12/14/2021) for CPE (fasting).  Wilfred Lacy, NP

## 2020-12-15 LAB — CERVICOVAGINAL ANCILLARY ONLY
Bacterial Vaginitis (gardnerella): POSITIVE — AB
Candida Glabrata: NEGATIVE
Candida Vaginitis: POSITIVE — AB
Chlamydia: NEGATIVE
Comment: NEGATIVE
Comment: NEGATIVE
Comment: NEGATIVE
Comment: NEGATIVE
Comment: NEGATIVE
Comment: NORMAL
Neisseria Gonorrhea: NEGATIVE
Trichomonas: NEGATIVE

## 2020-12-15 MED ORDER — FLUCONAZOLE 150 MG PO TABS: 150.0000 mg | ORAL_TABLET | Freq: Every day | ORAL | 0 refills | Status: DC

## 2020-12-15 MED ORDER — METRONIDAZOLE 0.75 % VA GEL: 1.0000 | Freq: Every day | VAGINAL | 0 refills | Status: DC

## 2020-12-15 NOTE — Addendum Note (Signed)
Addended by: Wilfred Lacy L on: 12/15/2020 02:46 PM   Modules accepted: Orders

## 2020-12-16 ENCOUNTER — Other Ambulatory Visit (INDEPENDENT_AMBULATORY_CARE_PROVIDER_SITE_OTHER): Payer: BC Managed Care – PPO

## 2020-12-16 ENCOUNTER — Other Ambulatory Visit: Payer: Self-pay

## 2020-12-16 DIAGNOSIS — Z113 Encounter for screening for infections with a predominantly sexual mode of transmission: Secondary | ICD-10-CM

## 2020-12-16 DIAGNOSIS — Z136 Encounter for screening for cardiovascular disorders: Secondary | ICD-10-CM

## 2020-12-16 DIAGNOSIS — Z0001 Encounter for general adult medical examination with abnormal findings: Secondary | ICD-10-CM

## 2020-12-16 DIAGNOSIS — Z13220 Encounter for screening for lipoid disorders: Secondary | ICD-10-CM | POA: Diagnosis not present

## 2020-12-16 LAB — LIPID PANEL
Cholesterol: 127 mg/dL (ref 0–200)
HDL: 53.2 mg/dL (ref >39.00)
LDL Cholesterol: 62 mg/dL (ref 0–99)
NonHDL: 73.46
Total CHOL/HDL Ratio: 2
Triglycerides: 59 mg/dL (ref 0.0–149.0)
VLDL: 11.8 mg/dL (ref 0.0–40.0)

## 2020-12-16 LAB — COMPREHENSIVE METABOLIC PANEL
ALT: 15 U/L (ref 0–35)
AST: 22 U/L (ref 0–37)
Albumin: 4.6 g/dL (ref 3.5–5.2)
Alkaline Phosphatase: 48 U/L (ref 39–117)
BUN: 13 mg/dL (ref 6–23)
CO2: 28 mEq/L (ref 19–32)
Calcium: 9.5 mg/dL (ref 8.4–10.5)
Chloride: 105 mEq/L (ref 96–112)
Creatinine, Ser: 0.83 mg/dL (ref 0.40–1.20)
GFR: 93.82 mL/min (ref >60.00)
Glucose, Bld: 83 mg/dL (ref 70–99)
Potassium: 3.7 mEq/L (ref 3.5–5.1)
Sodium: 140 mEq/L (ref 135–145)
Total Bilirubin: 0.7 mg/dL (ref 0.2–1.2)
Total Protein: 7.6 g/dL (ref 6.0–8.3)

## 2020-12-16 LAB — CBC WITH DIFFERENTIAL/PLATELET
Basophils Absolute: 0 10*3/uL (ref 0.0–0.1)
Basophils Relative: 0.6 % (ref 0.0–3.0)
Eosinophils Absolute: 0 10*3/uL (ref 0.0–0.7)
Eosinophils Relative: 0.5 % (ref 0.0–5.0)
HCT: 37.4 % (ref 36.0–46.0)
Hemoglobin: 12.4 g/dL (ref 12.0–15.0)
Lymphocytes Relative: 31.1 % (ref 12.0–46.0)
Lymphs Abs: 1.5 10*3/uL (ref 0.7–4.0)
MCHC: 33.2 g/dL (ref 30.0–36.0)
MCV: 85.8 fl (ref 78.0–100.0)
Monocytes Absolute: 0.2 10*3/uL (ref 0.1–1.0)
Monocytes Relative: 4.9 % (ref 3.0–12.0)
Neutro Abs: 3 10*3/uL (ref 1.4–7.7)
Neutrophils Relative %: 62.9 % (ref 43.0–77.0)
Platelets: 234 10*3/uL (ref 150.0–400.0)
RBC: 4.35 Mil/uL (ref 3.87–5.11)
RDW: 12.7 % (ref 11.5–15.5)
WBC: 4.8 10*3/uL (ref 4.0–10.5)

## 2020-12-16 LAB — TSH: TSH: 1.98 u[IU]/mL (ref 0.35–5.50)

## 2020-12-16 NOTE — Telephone Encounter (Signed)
Pharmacy has been updated.

## 2020-12-17 LAB — HEPATITIS C ANTIBODY
Hepatitis C Ab: NONREACTIVE
SIGNAL TO CUT-OFF: 0.03 (ref <1.00)

## 2020-12-17 LAB — HIV ANTIBODY (ROUTINE TESTING W REFLEX): HIV 1&2 Ab, 4th Generation: NONREACTIVE

## 2020-12-24 ENCOUNTER — Telehealth: Payer: Self-pay | Admitting: Nurse Practitioner

## 2020-12-24 NOTE — Telephone Encounter (Signed)
Pt called back for lab results

## 2020-12-24 NOTE — Telephone Encounter (Signed)
Returning pt call, was not able to reach pt and lvm.

## 2020-12-24 NOTE — Telephone Encounter (Signed)
Pt called back. °

## 2021-01-26 ENCOUNTER — Other Ambulatory Visit: Payer: BC Managed Care – PPO

## 2021-02-11 ENCOUNTER — Other Ambulatory Visit: Payer: Self-pay

## 2021-02-11 ENCOUNTER — Ambulatory Visit
Admission: EM | Admit: 2021-02-11 | Discharge: 2021-02-11 | Disposition: A | Discharge status: Home / Self Care | Payer: 59 | Attending: Emergency Medicine | Admitting: Emergency Medicine

## 2021-02-11 DIAGNOSIS — J029 Acute pharyngitis, unspecified: Secondary | ICD-10-CM

## 2021-02-11 DIAGNOSIS — R509 Fever, unspecified: Secondary | ICD-10-CM | POA: Diagnosis present

## 2021-02-11 DIAGNOSIS — H9203 Otalgia, bilateral: Secondary | ICD-10-CM | POA: Diagnosis present

## 2021-02-11 LAB — POCT RAPID STREP A (OFFICE): Rapid Strep A Screen: NEGATIVE

## 2021-02-11 MED ORDER — IBUPROFEN 400 MG PO TABS: 400.0000 mg | ORAL_TABLET | Freq: Three times a day (TID) | ORAL | 0 refills | Status: DC | PRN

## 2021-02-11 MED ORDER — FLUTICASONE PROPIONATE 50 MCG/ACT NA SUSP: 2.0000 | Freq: Every day | NASAL | 0 refills | Status: AC

## 2021-02-11 MED ORDER — GUAIFENESIN 400 MG PO TABS: ORAL_TABLET | ORAL | 0 refills | Status: DC

## 2021-02-11 NOTE — Discharge Instructions (Addendum)
Your symptoms and physical exam findings are concerning for a viral respiratory infection.     You were tested for both COVID and influenza today because here in the urgent care setting, we do not have an available option for an individual influenza test.  I apologize for the redundancy if you have already taken a COVID test at home.  The result of your viral testing will be posted to your MyChart once it is complete, this typically takes 24 to 48 hours.  If there is a positive result, you will be contacted by phone with further recommendations, if any.    Due to the duration of your symptoms, you would no longer benefit from antiviral therapy for influenza.  Conservative care is recommended at this time.  This includes rest, pushing clear fluids and activity as tolerated.  Warm beverages such as teas and broths versus cold beverages/popsicles and frozen sherbet/sorbet are your choice, both warm and cold are beneficial.  You may also notice that your appetite is reduced; this is okay as long as you are drinking plenty of clear fluids.    Your strep test today is negative.  Throat culture will be performed per our protocol.  The result of your throat culture will be posted to your MyChart once it is complete, this typically takes 3 to 5 days.  If there is a positive result, you will be contacted by phone and antibiotics will be prescribed for you.   Your symptoms and my physical exam findings are concerning for exacerbation of your underlying allergies.  It is important that you are consistent with taking allergy medications exactly as prescribed.  Not doing so increases your risk of more frequent upper respiratory infections that may or may not require the use of antibiotics, serious exacerbations that require the use of oral steroids, loss of time at work and missed social opportunities.   Conservative care is recommended at this time.  This includes rest, pushing clear fluids and activity as tolerated.   Warm beverages such as teas and broths versus cold beverages/popsicles and frozen sherbet/sorbet are your choice, both warm and cold are beneficial.  You may also notice that your appetite is reduced; this is okay as long as you are drinking plenty of clear fluids.    Please see the list below for recommended medications, dosages and frequencies to provide relief of your current symptoms:     Ibuprofen  (Advil, Motrin): This is a good anti-inflammatory medication which addresses aches, pains and inflammation of the upper airways that causes sinus and nasal congestion as well as in the lower airways which makes your cough feel tight and sometimes burn.  I recommend that you take between 400 to 600 mg every 6-8 hours as needed, I have provided you with a prescription for 400 mg.      Fluticasone (Flonase): This is a steroid nasal spray that you use once daily, 1 spray in each nare.  After 3 to 5 days of use, you will have significant improvement of the inflammation and mucus production that is being caused by exposure to allergens.  This medication can be purchased over-the-counter however I have provided you with a prescription.      Guaifenesin (Robitussin, Mucinex): This is an expectorant.  This helps break up chest congestion and loosen up thick nasal drainage making phlegm and drainage more liquid and therefore easier to remove.  I recommend being 400 mg three times daily as needed.  Please remain home from work, school, public places until you have been fever free for 24 hours without the use of antifever medications such as Tylenol or ibuprofen.    Please follow-up within the next 3 to 5 days either with your primary care provider or urgent care if your symptoms do not resolve.  If you do not have a primary care provider, we will assist you in finding one.

## 2021-02-11 NOTE — ED Triage Notes (Signed)
Pt states on Tuesday she began having ear pain and sore throat.

## 2021-02-11 NOTE — ED Provider Notes (Signed)
[facility]    CSN: [id number] Arrival date & time: 02/11/21  7564    HISTORY   Chief Complaint  Patient presents with   Sore Throat   Otalgia        HPI Teresa Myers is a 31 y.o. female. Patient states that 5 days ago she began to feel unwell and 3 days ago began to have stabbing sharp intermittent ear pain along with a sore throat.  Patient states she is not had difficulty swallowing but does have pain when swallowing, states she is also noticed that she is been very congested and has been having a lot of clear drainage from her nose.  Patient states she has been drinking herbal tea with no meaningful relief.  Patient that she is not been taking her medications at this time.  Patient denies known sick contacts.  Patient denies hearing loss, history of allergies or asthma.  Vital signs are essentially normal on arrival today, she does have a mildly elevated temperature.   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 Authorizing Provider   Family History Family History  Problem Relation Age of Onset   Diabetes Father    Cancer Neg Hx    Hypertension Neg Hx    Social History Social History    Tobacco Use   Smoking status: Never   Smokeless tobacco: Never  Substance Use Topics   Alcohol use: No   Drug use: No   Allergies   Other  Review of Systems Review of Systems Pertinent findings noted in history of present illness.   Physical Exam Triage Vital Signs ED Triage Vitals  Enc Vitals Group     BP 11/12/20 0827 (!) 147/82     Pulse Rate 11/12/20 0827 72     Resp 11/12/20 0827 18     Temp 11/12/20 0827 98.3 F (36.8 C)     Temp Source 11/12/20 0827 Oral     SpO2 11/12/20 0827 98 %     Weight --      Height --      Head Circumference --      Peak Flow --      Pain Score 11/12/20 0826 5     Pain Loc --      Pain Edu? --      Excl. in [place]? --   No data found.  Updated Vital Signs BP 100/70 (BP Location:  Left Arm)    Pulse 83    Temp 99.1 F (37.3 C) (Oral)    Resp 18    LMP 01/16/2021    SpO2 98%   Physical Exam Vitals and nursing note reviewed.  Constitutional:      General: She is not in acute distress.    Appearance: Normal appearance. She is not ill-appearing.  HENT:     Head: Normocephalic and atraumatic.     Salivary Glands: Right salivary gland is not diffusely enlarged or tender. Left salivary gland is not diffusely enlarged or tender.     Right Ear: Tympanic membrane, ear canal and external ear normal. No drainage. No middle ear effusion. There is no impacted cerumen. Tympanic membrane is not erythematous or bulging.     Left Ear: Tympanic membrane, ear canal and external ear normal. No drainage.  No middle ear effusion. There is no impacted cerumen. Tympanic membrane is not erythematous or bulging.     Ears:     Comments: Bilateral EACs with mild erythema    Nose: Nose normal. No nasal deformity, septal deviation, mucosal edema, congestion or rhinorrhea.     Right Turbinates: Not enlarged, swollen or pale.     Left Turbinates: Not enlarged, swollen or pale.     Right Sinus: No maxillary sinus tenderness or frontal sinus tenderness.     Left  Sinus: No maxillary sinus tenderness or frontal sinus tenderness.     Mouth/Throat:     Lips: Pink. No lesions.     Mouth: Mucous membranes are moist. No oral lesions.     Pharynx: Oropharynx is clear. Uvula midline. No posterior oropharyngeal erythema or uvula swelling.     Tonsils: No tonsillar exudate. 0 on the right. 0 on the left.  Eyes:     General: Lids are normal.        Right eye: No discharge.        Left eye: No discharge.     Extraocular Movements: Extraocular movements intact.     Conjunctiva/sclera: Conjunctivae normal.     Right eye: Right conjunctiva is not injected.     Left eye: Left conjunctiva is not injected.  Neck:     Trachea: Trachea and phonation normal.  Cardiovascular:     Rate and Rhythm: Normal rate and regular rhythm.     Pulses: Normal pulses.     Heart sounds: Normal heart sounds. No murmur heard.   No friction rub. No gallop.  Pulmonary:     Effort: Pulmonary effort is normal. No accessory muscle usage, prolonged expiration or respiratory distress.     Breath sounds: Normal breath sounds. No stridor, decreased air movement or transmitted upper airway sounds. No decreased breath sounds, wheezing, rhonchi or rales.  Chest:     Chest wall: No tenderness.  Musculoskeletal:        General: Normal range of motion.     Cervical back: Normal range of motion and neck supple. Normal range of motion.  Lymphadenopathy:     Cervical: No cervical adenopathy.  Skin:    General: Skin is warm and dry.     Findings: No erythema or rash.  Neurological:     General: No focal deficit present.     Mental Status: She is alert and oriented to person, place, and time.  Psychiatric:        Mood and Affect: Mood normal.        Behavior: Behavior normal.    Visual Acuity Right Eye  Distance:   Left Eye Distance:   Bilateral Distance:    Right Eye Near:   Left Eye Near:    Bilateral Near:     UC Couse / Diagnostics / Procedures:    EKG  Radiology No results  found.  Procedures Procedures (including critical care time)  UC Diagnoses / Final Clinical Impressions(s)   I have reviewed the triage vital signs and the nursing notes.  Pertinent labs & imaging results that were available during my care of the patient were reviewed by me and considered in my medical decision making (see chart for details).   Final diagnoses:  Acute pharyngitis, unspecified etiology  Otalgia of both ears  Elevated temperature   Rapid strep test is negative today, throat culture will be performed per protocol.  COVID/flu testing is pending, we will notify patient of results once received.  Patient is outside the window for antiviral therapy for influenza at this time.  If COVID test is positive, antiviral therapy would not be indicated due to lack of comorbidities.  Patient advised to continue supportive medications for symptoms, prescriptions provided.  Conservative care recommended.  Return precautions advised. ED Prescriptions     Medication Sig Dispense Auth. Provider   fluticasone (FLONASE) 50 MCG/ACT nasal spray Place 2 sprays into both nostrils daily. 18 mL Lynden Oxford Scales, PA-C   ibuprofen (ADVIL) 400 MG tablet Take 1 tablet (400 mg total) by mouth every 8 (eight) hours as needed for up to 30 doses. 30 tablet Lynden Oxford Scales, PA-C   guaifenesin (HUMIBID E) 400 MG TABS tablet Take 1 tablet 3 times daily as needed for chest congestion and cough 21 tablet Lynden Oxford Scales, PA-C      PDMP not reviewed this encounter.  Pending results:  Labs Reviewed  COVID-19, FLU A+B NAA  CULTURE, GROUP A STREP [facility])  POCT RAPID STREP A (OFFICE)    Medications Ordered in UC: Medications - No data to display  Disposition Upon Discharge:  Condition: stable for discharge home Home: take medications as prescribed; routine discharge instructions as discussed; follow up as advised.  Patient presented with an acute illness with associated systemic symptoms  and significant discomfort requiring urgent management. In my opinion, this is a condition that a prudent lay person (someone who possesses an average knowledge of health and medicine) may potentially expect to result in complications if not addressed urgently such as respiratory distress, impairment of bodily function or dysfunction of bodily organs.   Routine symptom specific, illness specific and/or disease specific instructions were discussed with the patient and/or caregiver at length.   As such, the patient has been evaluated and assessed, work-up was performed and treatment was provided in alignment with urgent care protocols and evidence based medicine.  Patient/parent/caregiver has been advised that the patient may require follow up for further testing and treatment if the symptoms continue in spite of treatment, as clinically indicated and appropriate.  If the patient was tested for COVID-19, Influenza and/or RSV, then the patient/parent/guardian was advised to isolate at home pending the results of his/her diagnostic coronavirus test and potentially longer if theyre positive. I have also advised pt that if his/her COVID-19 test returns positive, it's recommended to self-isolate for at least 10 days after symptoms first appeared AND until fever-free for 24 hours without fever reducer AND other symptoms have improved or resolved. Discussed self-isolation recommendations as well as instructions for household member/close contacts as per the CDC and [place] DHHS, and also gave patient the  COVID packet with this information.  Patient/parent/caregiver has been advised to return to the [facility] or PCP in 3-5 days if no better; to PCP or the Emergency Department if new signs and symptoms develop, or if the current signs or symptoms continue to change or worsen for further workup, evaluation and treatment as clinically indicated and appropriate  The patient will follow up with their current PCP if and as advised.  If the patient does not currently have a PCP we will assist them in obtaining one.   The patient may need specialty follow up if the symptoms continue, in spite of conservative treatment and management, for further workup, evaluation, consultation and treatment as clinically indicated and appropriate.  Patient/parent/caregiver verbalized understanding and agreement of plan as discussed.  All questions were addressed during visit.  Please see discharge instructions below for further details of plan.  Discharge Instructions:   Discharge Instructions      Your symptoms and physical exam findings are concerning for a viral respiratory infection.     You were tested for both COVID and influenza today because here in the urgent care setting, we do not have an available option for an individual influenza test.  I apologize for the redundancy if you have already taken a COVID test at home.  The result of your viral testing will be posted to your MyChart once it is complete, this typically takes 24 to 48 hours.  If there is a positive result, you will be contacted by phone with further recommendations, if any.    Due to the duration of your symptoms, you would no longer benefit from antiviral therapy for influenza.  Conservative care is recommended at this time.  This includes rest, pushing clear fluids and activity as tolerated.  Warm beverages such as teas and broths versus cold beverages/popsicles and frozen sherbet/sorbet are your choice, both warm and cold are beneficial.  You may also notice that your appetite is reduced; this is okay as long as you are drinking plenty of clear fluids.    Your strep test today is negative.  Throat culture will be performed per our protocol.  The result of your throat culture will be posted to your MyChart once it is complete, this typically takes 3 to 5 days.  If there is a positive result, you will be contacted by phone and antibiotics will be prescribed for you.    Your symptoms and my physical exam findings are concerning for exacerbation of your underlying allergies.  It is important that you are consistent with taking allergy medications exactly as prescribed.  Not doing so increases your risk of more frequent upper respiratory infections that may or may not require the use of antibiotics, serious exacerbations that require the use of oral steroids, loss of time at work and missed social opportunities.   Conservative care is recommended at this time.  This includes rest, pushing clear fluids and activity as tolerated.  Warm beverages such as teas and broths versus cold beverages/popsicles and frozen sherbet/sorbet are your choice, both warm and cold are beneficial.  You may also notice that your appetite is reduced; this is okay as long as you are drinking plenty of clear fluids.    Please see the list below for recommended medications, dosages and frequencies to provide relief of your current symptoms:     Ibuprofen  (Advil, Motrin): This is a good anti-inflammatory medication which addresses aches, pains and inflammation of the upper airways that causes sinus and  nasal congestion as well as in the lower airways which makes your cough feel tight and sometimes burn.  I recommend that you take between 400 to 600 mg every 6-8 hours as needed, I have provided you with a prescription for 400 mg.      Fluticasone (Flonase): This is a steroid nasal spray that you use once daily, 1 spray in each nare.  After 3 to 5 days of use, you will have significant improvement of the inflammation and mucus production that is being caused by exposure to allergens.  This medication can be purchased over-the-counter however I have provided you with a prescription.      Guaifenesin (Robitussin, Mucinex): This is an expectorant.  This helps break up chest congestion and loosen up thick nasal drainage making phlegm and drainage more liquid and therefore easier to remove.  I recommend  being 400 mg three times daily as needed.      Please remain home from work, school, public places until you have been fever free for 24 hours without the use of antifever medications such as Tylenol or ibuprofen.    Please follow-up within the next 3 to 5 days either with your primary care provider or urgent care if your symptoms do not resolve.  If you do not have a primary care provider, we will assist you in finding one.       This office note has been dictated using Museum/gallery curator.  Unfortunately, and despite my best efforts, this method of dictation can sometimes lead to occasional typographical or grammatical errors.  I apologize in advance if this occurs.     Lynden Oxford Scales, PA-C 02/11/21 1007

## 2021-02-13 LAB — COVID-19, FLU A+B NAA
Influenza A, NAA: NOT DETECTED
Influenza B, NAA: NOT DETECTED
SARS-CoV-2, NAA: NOT DETECTED

## 2021-02-13 LAB — CULTURE, GROUP A STREP (THRC)

## 2021-02-18 ENCOUNTER — Ambulatory Visit
Admission: RE | Admit: 2021-02-18 | Discharge: 2021-02-18 | Disposition: A | Discharge status: Home / Self Care | Payer: 59 | Source: Ambulatory Visit | Attending: Nurse Practitioner | Admitting: Nurse Practitioner

## 2021-02-18 ENCOUNTER — Ambulatory Visit
Admission: RE | Admit: 2021-02-18 | Discharge: 2021-02-18 | Disposition: A | Discharge status: Home / Self Care | Payer: BC Managed Care – PPO | Source: Ambulatory Visit | Attending: Nurse Practitioner | Admitting: Nurse Practitioner

## 2021-02-18 ENCOUNTER — Ambulatory Visit (INDEPENDENT_AMBULATORY_CARE_PROVIDER_SITE_OTHER): Payer: 59 | Admitting: Nurse Practitioner

## 2021-02-18 ENCOUNTER — Other Ambulatory Visit: Payer: Self-pay

## 2021-02-18 ENCOUNTER — Encounter: Payer: Self-pay | Admitting: Nurse Practitioner

## 2021-02-18 VITALS — BP 98/60 | HR 80 | Temp 97.7°F | Ht 63.75 in | Wt 115.6 lb

## 2021-02-18 DIAGNOSIS — N926 Irregular menstruation, unspecified: Secondary | ICD-10-CM

## 2021-02-18 DIAGNOSIS — L659 Nonscarring hair loss, unspecified: Secondary | ICD-10-CM | POA: Diagnosis not present

## 2021-02-18 DIAGNOSIS — N6322 Unspecified lump in the left breast, upper inner quadrant: Secondary | ICD-10-CM

## 2021-02-18 LAB — HCG, QUANTITATIVE, PREGNANCY: Quantitative HCG: 0.62 m[IU]/mL

## 2021-02-18 NOTE — Progress Notes (Signed)
Subjective:  Patient ID: Teresa Myers, female    DOB: 1990/09/06  Age: 31 y.o. MRN: [id number]  CC: Acute Visit (Pt c/o menstrual cycle issues, pt states she has had it twice this month 01/16/21 and again on 02/11/21. She would like to know if this is common.)  Vaginal Bleeding The patient's primary symptoms include vaginal bleeding. The patient's pertinent negatives include no genital itching, genital lesions, genital odor, genital rash, missed menses, pelvic pain or vaginal discharge. This is a new problem. The current episode started 1 to 4 weeks ago. The problem occurs intermittently. The problem has been resolved. The patient is experiencing no pain. Pertinent negatives include no abdominal pain, back pain, chills, discolored urine, dysuria, flank pain, frequency, painful intercourse or urgency. The vaginal discharge was bloody. The vaginal bleeding is typical of menses. She has not been passing clots. She has not been passing tissue. Nothing aggravates the symptoms. She has tried nothing for the symptoms. She is sexually active. No, her partner does not have an STD. She uses the rhythm method for contraception. Her menstrual history has been irregular. There is no history of endometriosis, menorrhagia, metrorrhagia, PID, an STD, a terminated pregnancy or vaginosis.  Menstrual cycle 01/16/21 to 01/22/21 and 02/11/21 to 02/16/21  Reviewed past Medical, Social and Family history today.  Outpatient Medications Prior to Visit  Medication Sig Dispense Refill   fluticasone (FLONASE) 50 MCG/ACT nasal spray Place 2 sprays into both nostrils daily. 18 mL 0   ibuprofen (ADVIL) 400 MG tablet Take 1 tablet (400 mg total) by mouth every 8 (eight) hours as needed for up to 30 doses. 30 tablet 0   guaifenesin (HUMIBID E) 400 MG TABS tablet Take 1 tablet 3 times daily as needed for chest congestion and cough (Patient not taking: Reported on 02/18/2021) 21 tablet 0   No facility-administered medications prior to  visit.    ROS See HPI  Objective:  BP 98/60 (BP Location: Right Arm, Patient Position: Sitting, Cuff Size: Normal)    Pulse 80    Temp 97.7 F (36.5 C) (Temporal)    Ht 5' 3.75" (1.619 m)    Wt 115 lb 9.6 oz (52.4 kg)    LMP 01/16/2021 (Exact Date) Comment: Again on 02/11/21   SpO2 98%    BMI 20.00 kg/m   Physical Exam Cardiovascular:     Rate and Rhythm: Normal rate.     Pulses: Normal pulses.  Pulmonary:     Effort: Pulmonary effort is normal.  Abdominal:     General: There is no distension.     Palpations: Abdomen is soft.     Tenderness: There is no abdominal tenderness. There is no right CVA tenderness, left CVA tenderness or guarding.  Neurological:     Mental Status: She is alert and oriented to person, place, and time.    Assessment & Plan:  This visit occurred during the SARS-CoV-2 public health emergency.  Safety protocols were in place, including screening questions prior to the visit, additional usage of staff PPE, and extensive cleaning of exam room while observing appropriate contact time as indicated for disinfecting solutions.   Elleah was seen today for acute visit.  Diagnoses and all orders for this visit:  Irregular periods/menstrual cycles -     hCG, quantitative, pregnancy  Patchy loss of hair -     Ambulatory referral to Dermatology  Continue to monitor cycle. Call office if cycle shortens to every 2wks of less or develop clots or  pelvic pain, call office  Problem List Items Addressed This Visit   None Visit Diagnoses     Irregular periods/menstrual cycles    -  Primary   Relevant Orders   hCG, quantitative, pregnancy   Patchy loss of hair       Relevant Orders   Ambulatory referral to Dermatology       Follow-up: Return if symptoms worsen or fail to improve.  Wilfred Lacy, NP

## 2021-02-18 NOTE — Patient Instructions (Signed)
Go to lab for blood draw You will be contacted to schedule appt with [facility] in [facility]  Abnormal Uterine Bleeding Abnormal uterine bleeding is unusual bleeding from the uterus. It includes bleeding after sex, or bleeding or spotting between menstrual periods. It may also include bleeding that is heavier than normal, menstrual periods that last longer than usual, or bleeding that occurs after menopause. Abnormal uterine bleeding can affect teenagers, women in their reproductive years, pregnant women, and women who have reached menopause. Common causes of abnormal uterine bleeding include: Pregnancy. Abnormal growths within the lining of the uterus (polyps). Benign tumors or growths in the uterus (fibroids). These are not cancer. Infection. Cancer. Too much or too little of some hormones in the body (hormonal imbalances). Any type of abnormal bleeding should be checked by a health care provider. Many cases are minor and simple to treat, but others may be more serious. Treatment will depend on the cause of the bleeding and how severe it is. Follow these instructions at home: Medicines Take over-the-counter and prescription medicines only as told by your health care provider. Ask your health care provider about: Taking medicines such as aspirin and ibuprofen. These medicines can thin your blood. Do not take these medicines unless your health care provider tells you to take them. Taking over-the-counter medicines, vitamins, herbs, and supplements. If you were prescribed iron pills, take them as told by your health care provider. Iron pills help to replace iron that your body loses because of this condition. Managing constipation In cases of severe bleeding, you may be asked to increase your iron intake to treat anemia. Doing this may cause constipation. To prevent or treat constipation, you may need to: Drink enough fluid to keep your urine pale yellow. Take over-the-counter or  prescription medicines. Eat foods that are high in fiber, such as beans, whole grains, and fresh fruits and vegetables. Limit foods that are high in fat and processed sugars, such as fried or sweet foods. Activity Alter your activity to decrease bleeding if you need to change your sanitary pad more than one time every 2 hours: Lie in bed with your feet raised (elevated). Place a cold pack on your lower abdomen. Rest as much as possible until the bleeding stops or slows down. General instructions Do not use tampons, douche, or have sex until your health care provider says these things are okay. Change your sanitary pads often. Get regular exams. These include pelvic exams and cervical cancer screenings. It is up to you to get the results of any tests that are done. Ask your health care provider, or the department that is doing the tests, when your results will be ready. Monitor your condition for any changes. For 2 months, write down: When your menstrual period starts. When your menstrual period ends. When any abnormal vaginal bleeding occurs. What problems you notice. Keep all follow-up visits. This is important. Contact a health care provider if: You have bleeding that lasts for more than one week. You feel dizzy at times. You feel nauseous or you vomit. You feel light-headed or weak. You notice any other changes that show that your condition is getting worse. Get help right away if: You faint. You have bleeding that soaks through a sanitary pad every hour. You have pain in the abdomen. You have a fever or chills. You become sweaty or weak. You pass large blood clots from your vagina. These symptoms may represent a serious problem that is an emergency. Do not wait  to see if the symptoms will go away. Get medical help right away. Call your local emergency services (911 in the [place] Do not drive yourself to the hospital. Summary Abnormal uterine bleeding is unusual bleeding from the  uterus. Any type of abnormal bleeding should be checked by a health care provider. Many cases are minor and simple to treat, but others may be more serious. Treatment will depend on the cause of the bleeding and how severe it is. Get help right away if you faint, you have bleeding that soaks through a sanitary pad every hour, or you pass large blood clots from your vagina. This information is not intended to replace advice given to you by your health care provider. Make sure you discuss any questions you have with your health care provider. Document Revised: 05/04/2020 Document Reviewed: 05/04/2020 Elsevier Patient Education  [place].

## 2021-02-25 ENCOUNTER — Other Ambulatory Visit: Payer: BC Managed Care – PPO

## 2021-03-22 ENCOUNTER — Ambulatory Visit (INDEPENDENT_AMBULATORY_CARE_PROVIDER_SITE_OTHER): Payer: 59 | Admitting: Nurse Practitioner

## 2021-03-22 ENCOUNTER — Other Ambulatory Visit: Payer: Self-pay

## 2021-03-22 ENCOUNTER — Encounter: Payer: Self-pay | Admitting: Nurse Practitioner

## 2021-03-22 VITALS — BP 100/60 | HR 94 | Temp 97.8°F | Ht 64.0 in | Wt 113.2 lb

## 2021-03-22 DIAGNOSIS — B353 Tinea pedis: Secondary | ICD-10-CM

## 2021-03-22 MED ORDER — TOLNAFTATE 1 % EX POWD: 1.0000 "application " | Freq: Two times a day (BID) | CUTANEOUS | 0 refills | Status: DC

## 2021-03-22 MED ORDER — TERBINAFINE HCL 250 MG PO TABS: 250.0000 mg | ORAL_TABLET | Freq: Once | ORAL | 0 refills | Status: AC

## 2021-03-22 NOTE — Progress Notes (Signed)
? ?  Subjective:  ?Patient ID: Teresa Myers, female    DOB: 1990-03-18  Age: 31 y.o. MRN: [id number] ? ?CC: Acute Visit (Pt c/o fungus on her feet x 1 year and worse in the past 3 weeks. Pt states in the past couple of weeks it has been itching and irritated. ) ? ?Rash ?This is a chronic problem. The current episode started more than 1 year ago. The problem has been waxing and waning since onset. The affected locations include the left foot and right foot. The rash is characterized by dryness, itchiness, scaling, redness and peeling. She was exposed to nothing. Pertinent negatives include no joint pain or nail changes. Treatments tried: antifungal spray. The treatment provided no relief. There is no history of allergies, asthma, eczema or varicella.  ? ?Reviewed past Medical, Social and Family history today. ? ?Outpatient Medications Prior to Visit  ?Medication Sig Dispense Refill  ? fluticasone (FLONASE) 50 MCG/ACT nasal spray Place 2 sprays into both nostrils daily. 18 mL 0  ? ?No facility-administered medications prior to visit.  ? ?ROS ?See HPI ? ?Objective:  ?BP 100/60 (BP Location: Left Arm, Patient Position: Sitting, Cuff Size: Normal)   Pulse 94   Temp 97.8 ?F (36.6 ?C) (Temporal)   Ht '5\' 4"'$  (1.626 m)   Wt 113 lb 3.2 oz (51.3 kg)   SpO2 97%   BMI 19.43 kg/m?  ? ?Physical Exam ?Cardiovascular:  ?   Pulses:     ?     Dorsalis pedis pulses are 2+ on the right side and 2+ on the left side.  ?     Posterior tibial pulses are 2+ on the right side and 2+ on the left side.  ?Feet:  ?   Right foot:  ?   Skin integrity: Fissure present. No erythema.  ?   Toenail Condition: Right toenails are normal.  ?   Left foot:  ?   Skin integrity: Fissure present. No erythema.  ?   Toenail Condition: Left toenails are normal.  ?Skin: ?   Findings: No erythema.  ? ?Assessment & Plan:  ?This visit occurred during the SARS-CoV-2 public health emergency.  Safety protocols were in place, including screening questions prior to  the visit, additional usage of staff PPE, and extensive cleaning of exam room while observing appropriate contact time as indicated for disinfecting solutions.  ? ?Jakya was seen today for acute visit. ? ?Diagnoses and all orders for this visit: ? ?Tinea pedis of both feet ?-     tolnaftate (TINACTIN) 1 % powder; Apply 1 application. topically 2 (two) times daily. ?-     terbinafine (LAMISIL) 250 MG tablet; Take 1 tablet (250 mg total) by mouth once for 1 dose. ? ?Soak feet daily in mixture of water and vinegar ?Spray shoes with Dr. Darrick Grinder Odor-x ? ?Problem List Items Addressed This Visit   ?None ?Visit Diagnoses   ? ? Tinea pedis of both feet    -  Primary  ? Relevant Medications  ? tolnaftate (TINACTIN) 1 % powder  ? terbinafine (LAMISIL) 250 MG tablet  ? ?  ?  ?Follow-up: Return if symptoms worsen or fail to improve. ? ?Wilfred Lacy, NP ?

## 2021-03-22 NOTE — Patient Instructions (Addendum)
Systane eye drop lubricant ?Apply warm compress 3-4x/day ?Clean eye with mild unscented soap. ? ?Soak feet daily in mixture of water and vinegar ? ?Spray shoes with Dr. Darrick Grinder Odor-x ? ? ?Athlete's Foot ?Athlete's foot (tinea pedis) is a fungal infection of the skin on your feet. It often occurs on the skin that is between or underneath your toes. It can also occur on the soles of your feet. Symptoms include itchy or white and flaky areas on the skin. The infection can spread from person to person (is contagious). It can also spread when a person's bare feet come in contact with the fungus on shower floors or on items such as shoes. ?Follow these instructions at home: ?Medicines ?Apply or take over-the-counter and prescription medicines only as told by your doctor. ?Apply your antifungal medicine as told by your doctor. Do not stop using it even if your feet start to get better. ?Foot care ?Do not scratch your feet. ?Keep your feet dry: ?Wear cotton or wool socks. Change your socks every day or if they become wet. ?Wear shoes that allow air to move around, such as sandals or canvas tennis shoes. ?Wash and dry your feet: ?Every day or as told by your doctor. ?After exercising. ?Including the area between your toes. ?General instructions ?Do not share any of these items that touch your feet: ?Towels. ?Shoes. ?Nail clippers. ?Other personal items. ?Protect your feet by wearing sandals in wet areas, such as locker rooms and shared showers. ?Keep all follow-up visits. ?If you have diabetes, keep your blood sugar under control. ?Contact a doctor if: ?You have a fever. ?You have swelling, pain, warmth, or redness in your foot. ?Your feet are not getting better with treatment. ?Your symptoms get worse. ?You have new symptoms. ?You have very bad pain. ?Summary ?Athlete's foot is a fungal infection of the skin on your feet. ?This condition is caused by a fungus that grows in warm, moist places. ?Symptoms include itchy or  white and flaky areas on the skin. ?Apply your antifungal medicine as told by your doctor. ?Keep your feet clean and dry. ?This information is not intended to replace advice given to you by your health care provider. Make sure you discuss any questions you have with your health care provider. ?Document Revised: 04/25/2020 Document Reviewed: 04/25/2020 ?Elsevier Patient Education ? [place]. ? ?

## 2021-06-24 ENCOUNTER — Encounter: Payer: Self-pay | Admitting: Nurse Practitioner

## 2021-06-24 ENCOUNTER — Ambulatory Visit (INDEPENDENT_AMBULATORY_CARE_PROVIDER_SITE_OTHER): Payer: 59 | Admitting: Nurse Practitioner

## 2021-06-24 VITALS — BP 118/80 | HR 93 | Temp 98.1°F | Ht 64.0 in | Wt 118.0 lb

## 2021-06-24 DIAGNOSIS — B3731 Acute candidiasis of vulva and vagina: Secondary | ICD-10-CM | POA: Diagnosis not present

## 2021-06-24 DIAGNOSIS — B353 Tinea pedis: Secondary | ICD-10-CM

## 2021-06-24 MED ORDER — CLOTRIMAZOLE 1 % VA CREA: 1.0000 | TOPICAL_CREAM | Freq: Every day | VAGINAL | 0 refills | Status: DC

## 2021-06-24 MED ORDER — TOLNAFTATE 1 % EX POWD: 1.0000 "application " | Freq: Two times a day (BID) | CUTANEOUS | 1 refills | Status: DC

## 2021-06-24 MED ORDER — FLUCONAZOLE 150 MG PO TABS: 150.0000 mg | ORAL_TABLET | Freq: Every day | ORAL | 0 refills | Status: DC

## 2021-06-24 NOTE — Progress Notes (Signed)
                Established Patient Visit  Patient: Teresa Myers   DOB: 1990-04-28   31 y.o. Female  MRN: [id number] Visit Date: 06/24/2021  Subjective:    Chief Complaint  Patient presents with   Office Visit    F/u Vaginal Itching Had relief for a while but since 06/20/2021 the itching has become more intense.    Vaginal Itching The patient's primary symptoms include genital itching and vaginal discharge. The patient's pertinent negatives include no genital lesions, genital odor, genital rash, missed menses, pelvic pain or vaginal bleeding. This is a new problem. The current episode started in the past 7 days. The problem occurs constantly. The problem has been unchanged.  No concern about STI Describes discharge as white and lumpy. Had similar symptoms before or after menstrual cycle LMP 05/29/2021, regular.  Persistent tinea pedis: some improvement with lamisil tab x 1 and tinactin powder x 2weeks. No erythema, no swelling.  Reviewed medical, surgical, and social history today  Medications: Outpatient Medications Prior to Visit  Medication Sig   fluticasone (FLONASE) 50 MCG/ACT nasal spray Place 2 sprays into both nostrils daily.   [DISCONTINUED] tolnaftate (TINACTIN) 1 % powder Apply 1 application. topically 2 (two) times daily.   No facility-administered medications prior to visit.   Reviewed past medical and social history.   ROS per HPI above      Objective:  BP 118/80 (BP Location: Right Arm, Patient Position: Sitting, Cuff Size: Normal)   Pulse 93   Temp 98.1 F (36.7 C) (Temporal)   Ht '5\' 4"'$  (1.626 m)   Wt 118 lb (53.5 kg)   LMP 05/29/2021 (Exact Date)   SpO2 99%   BMI 20.25 kg/m      Physical Exam Constitutional:      General: She is not in acute distress. Cardiovascular:     Rate and Rhythm: Normal rate.  Abdominal:     General: There is no distension.     Tenderness: There is no abdominal tenderness.  Genitourinary:    Comments:  declined Neurological:     Mental Status: She is alert and oriented to person, place, and time.     No results found for any visits on 06/24/21.    Assessment & Plan:    Problem List Items Addressed This Visit       Musculoskeletal and Integument   Tinea pedis of both feet   Relevant Medications   tolnaftate (TINACTIN) 1 % powder   fluconazole (DIFLUCAN) 150 MG tablet   Other Visit Diagnoses     Vaginal yeast infection    -  Primary   Relevant Medications   tolnaftate (TINACTIN) 1 % powder   fluconazole (DIFLUCAN) 150 MG tablet   clotrimazole (GYNE-LOTRIMIN) 1 % vaginal cream     Consider use of boric acid vaginal suppository to manage recurrent BV and yeast. Resume use of tinactin powder. Do not use same wash cloth on upper and lower body. Wear socks prior to underwear. Sock feet in water and vinegar mixture daily.  Return if symptoms worsen or fail to improve.     Wilfred Lacy, NP

## 2021-06-24 NOTE — Patient Instructions (Addendum)
Consider use of boric acid vaginal suppository to manage recurrent BV and yeast. Resume use of tinactin powder. Do not use same wash cloth on upper and lower body. Wear socks prior to underwear. Sock feet in water and vinegar mixture daily.  Vaginal Yeast Infection, Adult  Vaginal yeast infection is a condition that causes vaginal discharge as well as soreness, swelling, and redness (inflammation) of the vagina. This is a common condition. Some women get this infection frequently. What are the causes? This condition is caused by a change in the normal balance of the yeast (Candida) and normal bacteria that live in the vagina. This change causes an overgrowth of yeast, which causes the inflammation. What increases the risk? The condition is more likely to develop in women who: Take antibiotic medicines. Have diabetes. Take birth control pills. Are pregnant. Douche often. Have a weak body defense system (immune system). Have been taking steroid medicines for a long time. Frequently wear tight clothing. What are the signs or symptoms? Symptoms of this condition include: White, thick, creamy vaginal discharge. Swelling, itching, redness, and irritation of the vagina. The lips of the vagina (labia) may be affected as well. Pain or a burning feeling while urinating. Pain during sex. How is this diagnosed? This condition is diagnosed based on: Your medical history. A physical exam. A pelvic exam. Your health care provider will examine a sample of your vaginal discharge under a microscope. Your health care provider may send this sample for testing to confirm the diagnosis. How is this treated? This condition is treated with medicine. Medicines may be over-the-counter or prescription. You may be told to use one or more of the following: Medicine that is taken by mouth (orally). Medicine that is applied as a cream (topically). Medicine that is inserted directly into the vagina  (suppository). Follow these instructions at home: Take or apply over-the-counter and prescription medicines only as told by your health care provider. Do not use tampons until your health care provider approves. Do not have sex until your infection has cleared. Sex can prolong or worsen your symptoms of infection. Ask your health care provider when it is safe to resume sexual activity. Keep all follow-up visits. This is important. How is this prevented?  Do not wear tight clothes, such as pantyhose or tight pants. Wear breathable cotton underwear. Do not use douches, perfumed soap, creams, or powders. Wipe from front to back after using the toilet. If you have diabetes, keep your blood sugar levels under control. Ask your health care provider for other ways to prevent yeast infections. Contact a health care provider if: You have a fever. Your symptoms go away and then return. Your symptoms do not get better with treatment. Your symptoms get worse. You have new symptoms. You develop blisters in or around your vagina. You have blood coming from your vagina and it is not your menstrual period. You develop pain in your abdomen. Summary Vaginal yeast infection is a condition that causes discharge as well as soreness, swelling, and redness (inflammation) of the vagina. This condition is treated with medicine. Medicines may be over-the-counter or prescription. Take or apply over-the-counter and prescription medicines only as told by your health care provider. Do not douche. Resume sexual activity or use of tampons as instructed by your health care provider. Contact a health care provider if your symptoms do not get better with treatment or your symptoms go away and then return. This information is not intended to replace advice given to you  by your health care provider. Make sure you discuss any questions you have with your health care provider. Document Revised: 03/22/2020 Document Reviewed:  03/22/2020 Elsevier Patient Education  [place].

## 2022-03-17 ENCOUNTER — Other Ambulatory Visit (HOSPITAL_COMMUNITY)
Admission: RE | Admit: 2022-03-17 | Discharge: 2022-03-17 | Disposition: A | Discharge status: Home / Self Care | Payer: 59 | Source: Ambulatory Visit | Attending: Nurse Practitioner | Admitting: Nurse Practitioner

## 2022-03-17 ENCOUNTER — Ambulatory Visit (INDEPENDENT_AMBULATORY_CARE_PROVIDER_SITE_OTHER): Payer: 59 | Admitting: Nurse Practitioner

## 2022-03-17 ENCOUNTER — Encounter: Payer: Self-pay | Admitting: Nurse Practitioner

## 2022-03-17 VITALS — BP 112/80 | HR 64 | Temp 98.0°F | Resp 16 | Ht 64.0 in | Wt 130.0 lb

## 2022-03-17 DIAGNOSIS — B353 Tinea pedis: Secondary | ICD-10-CM | POA: Diagnosis not present

## 2022-03-17 DIAGNOSIS — S91209A Unspecified open wound of unspecified toe(s) with damage to nail, initial encounter: Secondary | ICD-10-CM

## 2022-03-17 DIAGNOSIS — N760 Acute vaginitis: Secondary | ICD-10-CM

## 2022-03-17 MED ORDER — TOLNAFTATE 1 % EX POWD: 1.0000 | Freq: Two times a day (BID) | CUTANEOUS | 1 refills | Status: AC

## 2022-03-17 NOTE — Progress Notes (Signed)
Established Patient Visit  Patient: Teresa Myers   DOB: Apr 30, 1990   32 y.o. Female  MRN: [id number] Visit Date: 03/17/2022  Subjective:    Chief Complaint  Patient presents with   Vaginitis    Pt c/o vaginal odor and white/yellowish discharge for a couple of weeks.   Left 4th toe nail is loose. No pain- Refill for tinactin powder    Vaginal Discharge The patient's primary symptoms include a genital odor and vaginal discharge. The patient's pertinent negatives include no genital itching, genital lesions, genital rash, missed menses, pelvic pain or vaginal bleeding. This is a recurrent problem. The current episode started 1 to 4 weeks ago. The problem occurs constantly. The problem has been unchanged. The patient is experiencing no pain. She is not pregnant. Pertinent negatives include no abdominal pain, anorexia, back pain, chills, discolored urine, dysuria, fever, flank pain, frequency, hematuria, painful intercourse, rash, sore throat or urgency. The vaginal discharge was white and thick. There has been no bleeding. She has not been passing clots. She has not been passing tissue. Exacerbated by: unsure. She has tried nothing for the symptoms. She is sexually active. No, her partner does not have an STD. She uses nothing for contraception. Her menstrual history has been regular. Her past medical history is significant for vaginosis. There is no history of an STD.  Declined need for Std screen.  Reviewed medical, surgical, and social history today  Medications: Outpatient Medications Prior to Visit  Medication Sig   fluticasone (FLONASE) 50 MCG/ACT nasal spray Place 2 sprays into both nostrils daily.   [DISCONTINUED] tolnaftate (TINACTIN) 1 % powder Apply 1 application  topically 2 (two) times daily.   [DISCONTINUED] clotrimazole (GYNE-LOTRIMIN) 1 % vaginal cream Place 1 Applicatorful vaginally at bedtime.   [DISCONTINUED] fluconazole (DIFLUCAN) 150 MG tablet Take 1 tablet  (150 mg total) by mouth daily. Take second tab 3days apart from first tab   No facility-administered medications prior to visit.   Reviewed past medical and social history.   ROS per HPI above      Objective:  BP 112/80 (BP Location: Left Arm, Patient Position: Sitting, Cuff Size: Normal)   Pulse 64   Temp 98 F (36.7 C) (Temporal)   Resp 16   Ht '5\' 4"'$  (1.626 m)   Wt 130 lb (59 kg)   SpO2 99%   BMI 22.31 kg/m      Physical Exam Vitals reviewed.  Cardiovascular:     Pulses:          Dorsalis pedis pulses are 2+ on the left side.       Posterior tibial pulses are 2+ on the left side.  Genitourinary:    Comments: opted to self swab Feet:     Left foot:     Skin integrity: Skin integrity normal. No erythema.  Neurological:     Mental Status: She is alert and oriented to person, place, and time.     No results found for any visits on 03/17/22.    Assessment & Plan:    Problem List Items Addressed This Visit       Musculoskeletal and Integument   Tinea pedis of both feet   Relevant Medications   tolnaftate (TINACTIN) 1 % powder   Other Visit Diagnoses     Recurrent vaginitis    -  Primary   Relevant Orders   Cervicovaginal ancillary only( [facility])  Traumatic avulsion of nail plate of toe, initial encounter         Try boric acid vaginal suppository, insert 1capsule 1-2x/day x 3days, then stop. You will be contacted with results Avoid washing underwear in scented detergent, avoid use of fabric softner or dryer sheet. Do not use perineal or vaginal deodorant Avoid baths with scented soaps or bath bumps. Use bandaid on toenail and allow to grow out. No sign of infection.  Return in about 3 months (around 06/17/2022), or if symptoms worsen or fail to improve, for CPE (fasting) with PAP.     Wilfred Lacy, NP

## 2022-03-17 NOTE — Patient Instructions (Addendum)
Systane or refresh eye lubricant for dry eyes  Try boric acid vaginal suppository, insert 1capsule 1-2x/day x 3days, then stop. You will be contacted with results Avoid washing underwear in scented detergent, avoid use of fabric softner or dryer sheet. Do not use perineal or vaginal deodorant Avoid baths with scented soaps or bath bumps.  Centrum or Alive or Petra Kuba made multivitamin.

## 2022-03-21 LAB — CERVICOVAGINAL ANCILLARY ONLY
Bacterial Vaginitis (gardnerella): POSITIVE — AB
Candida Glabrata: NEGATIVE
Candida Vaginitis: POSITIVE — AB
Comment: NEGATIVE
Comment: NEGATIVE
Comment: NEGATIVE

## 2022-03-22 MED ORDER — FLUCONAZOLE 150 MG PO TABS: 150.0000 mg | ORAL_TABLET | Freq: Every day | ORAL | 0 refills | Status: AC

## 2022-03-22 MED ORDER — METRONIDAZOLE 0.75 % VA GEL: 1.0000 | Freq: Every day | VAGINAL | 0 refills | Status: AC

## 2022-03-22 NOTE — Progress Notes (Signed)
Positive BV and and yeast Sent metrogel and diflucan When was your last menstrual cycle?

## 2022-03-22 NOTE — Addendum Note (Signed)
Addended by: Leana Gamer on: 03/22/2022 03:13 PM   Modules accepted: Orders

## 2022-06-19 ENCOUNTER — Encounter: Payer: 59 | Admitting: Nurse Practitioner

## 2022-07-05 ENCOUNTER — Ambulatory Visit (INDEPENDENT_AMBULATORY_CARE_PROVIDER_SITE_OTHER): Payer: 59 | Admitting: Nurse Practitioner

## 2022-07-05 ENCOUNTER — Encounter: Payer: Self-pay | Admitting: Nurse Practitioner

## 2022-07-05 VITALS — BP 122/74 | HR 85 | Temp 98.1°F | Ht 64.0 in | Wt 131.2 lb

## 2022-07-05 DIAGNOSIS — G8929 Other chronic pain: Secondary | ICD-10-CM | POA: Diagnosis not present

## 2022-07-05 DIAGNOSIS — D582 Other hemoglobinopathies: Secondary | ICD-10-CM

## 2022-07-05 DIAGNOSIS — Z0000 Encounter for general adult medical examination without abnormal findings: Secondary | ICD-10-CM

## 2022-07-05 DIAGNOSIS — Z13220 Encounter for screening for lipoid disorders: Secondary | ICD-10-CM

## 2022-07-05 DIAGNOSIS — M25562 Pain in left knee: Secondary | ICD-10-CM

## 2022-07-05 DIAGNOSIS — Z136 Encounter for screening for cardiovascular disorders: Secondary | ICD-10-CM | POA: Diagnosis not present

## 2022-07-05 DIAGNOSIS — Z0001 Encounter for general adult medical examination with abnormal findings: Secondary | ICD-10-CM

## 2022-07-05 LAB — LIPID PANEL
Cholesterol: 173 mg/dL (ref 0–200)
HDL: 62.8 mg/dL (ref >39.00)
LDL Cholesterol: 95 mg/dL (ref 0–99)
NonHDL: 110.65
Total CHOL/HDL Ratio: 3
Triglycerides: 76 mg/dL (ref 0.0–149.0)
VLDL: 15.2 mg/dL (ref 0.0–40.0)

## 2022-07-05 LAB — COMPREHENSIVE METABOLIC PANEL
ALT: 17 U/L (ref 0–35)
AST: 20 U/L (ref 0–37)
Albumin: 4.4 g/dL (ref 3.5–5.2)
Alkaline Phosphatase: 49 U/L (ref 39–117)
BUN: 13 mg/dL (ref 6–23)
CO2: 27 mEq/L (ref 19–32)
Calcium: 9.4 mg/dL (ref 8.4–10.5)
Chloride: 104 mEq/L (ref 96–112)
Creatinine, Ser: 0.74 mg/dL (ref 0.40–1.20)
GFR: 106.51 mL/min (ref >60.00)
Glucose, Bld: 93 mg/dL (ref 70–99)
Potassium: 4.2 mEq/L (ref 3.5–5.1)
Sodium: 138 mEq/L (ref 135–145)
Total Bilirubin: 0.6 mg/dL (ref 0.2–1.2)
Total Protein: 7.4 g/dL (ref 6.0–8.3)

## 2022-07-05 LAB — CBC
HCT: 37.5 % (ref 36.0–46.0)
Hemoglobin: 12.3 g/dL (ref 12.0–15.0)
MCHC: 32.9 g/dL (ref 30.0–36.0)
MCV: 87.6 fl (ref 78.0–100.0)
Platelets: 235 10*3/uL (ref 150.0–400.0)
RBC: 4.28 Mil/uL (ref 3.87–5.11)
RDW: 13.5 % (ref 11.5–15.5)
WBC: 5.2 10*3/uL (ref 4.0–10.5)

## 2022-07-05 NOTE — Assessment & Plan Note (Signed)
Ref to pt Use tylenol or NSAID or voltaren gel prn

## 2022-07-05 NOTE — Progress Notes (Signed)
Stable Follow instructions as discussed during office visit.

## 2022-07-05 NOTE — Patient Instructions (Addendum)
Go to lab Continue Heart healthy diet and daily exercise. You will be contacted to schedule appointment with PT and GYN  Preventive Care 21-33 Years Old, Female Preventive care refers to lifestyle choices and visits with your health care provider that can promote health and wellness. Preventive care visits are also called wellness exams. What can I expect for my preventive care visit? Counseling During your preventive care visit, your health care provider may ask about your: Medical history, including: Past medical problems. Family medical history. Pregnancy history. Current health, including: Menstrual cycle. Method of birth control. Emotional well-being. Home life and relationship well-being. Sexual activity and sexual health. Lifestyle, including: Alcohol, nicotine or tobacco, and drug use. Access to firearms. Diet, exercise, and sleep habits. Work and work Astronomer. Sunscreen use. Safety issues such as seatbelt and bike helmet use. Physical exam Your health care provider may check your: Height and weight. These may be used to calculate your BMI (body mass index). BMI is a measurement that tells if you are at a healthy weight. Waist circumference. This measures the distance around your waistline. This measurement also tells if you are at a healthy weight and may help predict your risk of certain diseases, such as type 2 diabetes and high blood pressure. Heart rate and blood pressure. Body temperature. Skin for abnormal spots. What immunizations do I need?  Vaccines are usually given at various ages, according to a schedule. Your health care provider will recommend vaccines for you based on your age, medical history, and lifestyle or other factors, such as travel or where you work. What tests do I need? Screening Your health care provider may recommend screening tests for certain conditions. This may include: Pelvic exam and Pap test. Lipid and cholesterol  levels. Diabetes screening. This is done by checking your blood sugar (glucose) after you have not eaten for a while (fasting). Hepatitis B test. Hepatitis C test. HIV (human immunodeficiency virus) test. STI (sexually transmitted infection) testing, if you are at risk. BRCA-related cancer screening. This may be done if you have a family history of breast, ovarian, tubal, or peritoneal cancers. Talk with your health care provider about your test results, treatment options, and if necessary, the need for more tests. Follow these instructions at home: Eating and drinking  Eat a healthy diet that includes fresh fruits and vegetables, whole grains, lean protein, and low-fat dairy products. Take vitamin and mineral supplements as recommended by your health care provider. Do not drink alcohol if: Your health care provider tells you not to drink. You are pregnant, may be pregnant, or are planning to become pregnant. If you drink alcohol: Limit how much you have to 0-1 drink a day. Know how much alcohol is in your drink. In the [place] one drink equals one 12 oz bottle of beer (355 mL), one 5 oz glass of wine (148 mL), or one 1 oz glass of hard liquor (44 mL). Lifestyle Brush your teeth every morning and night with fluoride toothpaste. Floss one time each day. Exercise for at least 30 minutes 5 or more days each week. Do not use any products that contain nicotine or tobacco. These products include cigarettes, chewing tobacco, and vaping devices, such as e-cigarettes. If you need help quitting, ask your health care provider. Do not use drugs. If you are sexually active, practice safe sex. Use a condom or other form of protection to prevent STIs. If you do not wish to become pregnant, use a form of birth control. If you  plan to become pregnant, see your health care provider for a prepregnancy visit. Find healthy ways to manage stress, such as: Meditation, yoga, or listening to  music. Journaling. Talking to a trusted person. Spending time with friends and family. Minimize exposure to UV radiation to reduce your risk of skin cancer. Safety Always wear your seat belt while driving or riding in a vehicle. Do not drive: If you have been drinking alcohol. Do not ride with someone who has been drinking. If you have been using any mind-altering substances or drugs. While texting. When you are tired or distracted. Wear a helmet and other protective equipment during sports activities. If you have firearms in your house, make sure you follow all gun safety procedures. Seek help if you have been physically or sexually abused. What's next? Go to your health care provider once a year for an annual wellness visit. Ask your health care provider how often you should have your eyes and teeth checked. Stay up to date on all vaccines. This information is not intended to replace advice given to you by your health care provider. Make sure you discuss any questions you have with your health care provider. Document Revised: 06/30/2020 Document Reviewed: 06/30/2020 Elsevier Patient Education  2024 [employer].

## 2022-07-05 NOTE — Progress Notes (Signed)
Complete physical exam  Patient: Teresa Myers   DOB: [birth year]   32 y.o. Female  MRN: [id number] Visit Date: 07/05/2022  Subjective:    Chief Complaint  Patient presents with   Annual Exam   AUDRA HERT is a 32 y.o. female who presents today for a complete physical exam. She reports consuming a general diet.  Cardio and weight training 5x/week  She generally feels well. She reports sleeping well. She does not have additional problems to discuss today.  Vision:Yes Dental:Yes STD Screen:No  BP Readings from Last 3 Encounters:  07/05/22 122/74  03/17/22 112/80  06/24/21 118/80   Wt Readings from Last 3 Encounters:  07/05/22 131 lb 3.2 oz (59.5 kg)  03/17/22 130 lb (59 kg)  06/24/21 118 lb (53.5 kg)   Most recent fall risk assessment:    07/05/2022    8:50 AM  Fall Risk   Falls in the past year? 0  Number falls in past yr: 0  Injury with Fall? 0  Risk for fall due to : No Fall Risks  Follow up Falls evaluation completed     Depression screen:Yes - No Depression Most recent depression screenings:    07/05/2022    8:51 AM 03/17/2022   10:22 AM  PHQ 2/9 Scores  PHQ - 2 Score 0 0   Knee Pain  The incident occurred more than 1 week ago (ongoing for several years). There was no injury mechanism. The pain is present in the left knee. The quality of the pain is described as aching. The pain is moderate. The pain has been Intermittent since onset. Pertinent negatives include no inability to bear weight, loss of motion, loss of sensation, muscle weakness, numbness or tingling. She reports no foreign bodies present. The symptoms are aggravated by weight bearing and movement. She has tried rest for the symptoms. The treatment provided moderate relief.    Hemoglobin C trait (HCC) Repeat cbc  Chronic pain of left knee Ref to pt Use tylenol or NSAID or voltaren gel prn  Past Medical History:  Diagnosis Date   Gestational diabetes    History of gestational hypertension     Hx of gonorrhea    Medical history non-contributory    Vaginal Pap smear, abnormal    HPV 2019   Past Surgical History:  Procedure Laterality Date   VAGINA SURGERY  2011   laser surgery   Social History   Socioeconomic History   Marital status: Married    Spouse name: Not on file   Number of children: Not on file   Years of education: Not on file   Highest education level: Not on file  Occupational History   Not on file  Tobacco Use   Smoking status: Never   Smokeless tobacco: Never  Vaping Use   Vaping Use: Never used  Substance and Sexual Activity   Alcohol use: No   Drug use: No   Sexual activity: Yes    Birth control/protection: Other-see comments    Comment: undecided, desires more education  Other Topics Concern   Not on file  Social History Narrative   Not on file   Social Determinants of Health   Financial Resource Strain: Not on file  Food Insecurity: Not on file  Transportation Needs: Not on file  Physical Activity: Not on file  Stress: Not on file  Social Connections: Not on file  Intimate Partner Violence: Not on file   Family Status  Relation Name Status  Father  (Not Specified)   Neg Hx  (Not Specified)   Family History  Problem Relation Age of Onset   Diabetes Father    Cancer Neg Hx    Hypertension Neg Hx    Allergies  Allergen Reactions   Other Dermatitis    Dairy causes eczema   Pineapple Cough, Other (See Comments) and Swelling    Patient Care Team: Kathrynn Backstrom, Bonna Gains, NP as PCP - General (Internal Medicine)   Medications: Outpatient Medications Prior to Visit  Medication Sig Note   fluticasone (FLONASE) 50 MCG/ACT nasal spray Place 2 sprays into both nostrils daily.    metroNIDAZOLE (METROGEL) 0.75 % vaginal gel Place 1 Applicatorful vaginally at bedtime. 07/05/2022: As needed   tolnaftate (TINACTIN) 1 % powder Apply 1 Application topically 2 (two) times daily. 07/05/2022: As needed   fluconazole (DIFLUCAN) 150 MG tablet  Take 1 tablet (150 mg total) by mouth daily. Take second tab 3days apart from first tab (Patient not taking: Reported on 07/05/2022)    No facility-administered medications prior to visit.   Review of Systems  Constitutional:  Negative for activity change, appetite change and unexpected weight change.  Respiratory: Negative.    Cardiovascular: Negative.   Gastrointestinal: Negative.   Endocrine: Negative for cold intolerance and heat intolerance.  Genitourinary: Negative.   Musculoskeletal:  Positive for joint swelling.  Skin: Negative.   Neurological: Negative.  Negative for tingling and numbness.  Hematological: Negative.   Psychiatric/Behavioral:  Negative for behavioral problems, decreased concentration, dysphoric mood, hallucinations, self-injury, sleep disturbance and suicidal ideas. The patient is not nervous/anxious.         Objective:  BP 122/74 (BP Location: Left Arm, Patient Position: Sitting, Cuff Size: Normal)   Pulse 85   Temp 98.1 F (36.7 C) (Oral)   Ht 5\' 4"  (1.626 m)   Wt 131 lb 3.2 oz (59.5 kg)   LMP 06/17/2022 (Exact Date)   SpO2 97%   BMI 22.52 kg/m     Physical Exam Vitals and nursing note reviewed.  Constitutional:      General: She is not in acute distress. HENT:     Right Ear: Tympanic membrane, ear canal and external ear normal.     Left Ear: Tympanic membrane, ear canal and external ear normal.     Nose: Nose normal.  Eyes:     Extraocular Movements: Extraocular movements intact.     Conjunctiva/sclera: Conjunctivae normal.     Pupils: Pupils are equal, round, and reactive to light.  Neck:     Thyroid: No thyroid mass, thyromegaly or thyroid tenderness.  Cardiovascular:     Rate and Rhythm: Normal rate and regular rhythm.     Pulses: Normal pulses.     Heart sounds: Normal heart sounds.  Pulmonary:     Effort: Pulmonary effort is normal.     Breath sounds: Normal breath sounds.  Abdominal:     General: Bowel sounds are normal.      Palpations: Abdomen is soft.  Genitourinary:    Comments: She deferred breast and pelvic exam to GYN Musculoskeletal:        General: Normal range of motion.     Cervical back: Normal range of motion and neck supple.     Right upper leg: Normal.     Left upper leg: Normal.     Right knee: Crepitus present. No swelling, deformity, effusion, erythema or bony tenderness. Normal range of motion. No tenderness.     Instability Tests: Anterior drawer  test negative. Posterior drawer test negative.     Left knee: Crepitus present. No swelling, deformity, effusion, erythema or bony tenderness. Normal range of motion. No tenderness.     Instability Tests: Anterior drawer test negative. Posterior drawer test negative.     Right lower leg: Normal. No edema.     Left lower leg: Normal. No edema.  Lymphadenopathy:     Cervical: No cervical adenopathy.  Skin:    General: Skin is warm and dry.  Neurological:     Mental Status: She is alert and oriented to person, place, and time.     Cranial Nerves: No cranial nerve deficit.  Psychiatric:        Mood and Affect: Mood normal.        Behavior: Behavior normal.        Thought Content: Thought content normal.     No results found for any visits on 07/05/22.    Assessment & Plan:    Routine Health Maintenance and Physical Exam  Immunization History  Administered Date(s) Administered   Influenza,inj,Quad PF,6+ Mos 11/05/2018, 12/14/2020   MMR 07/04/2015   PFIZER(Purple Top)SARS-COV-2 Vaccination 10/07/2019, 10/28/2019   Tdap 04/02/2015, 01/08/2019   Health Maintenance  Topic Date Due   PAP SMEAR-Modifier  09/01/2021   COVID-19 Vaccine (3 - 2023-24 season) 09/16/2021   INFLUENZA VACCINE  08/17/2022   DTaP/Tdap/Td (3 - Td or Tdap) 01/07/2029   Hepatitis C Screening  Completed   HIV Screening  Completed   HPV VACCINES  Aged Out   Discussed health benefits of physical activity, and encouraged her to engage in regular exercise appropriate for  her age and condition.  Problem List Items Addressed This Visit       Other   Chronic pain of left knee    Ref to pt Use tylenol or NSAID or voltaren gel prn      Relevant Orders   Ambulatory referral to Physical Therapy   Hemoglobin C trait (HCC)    Repeat cbc      Relevant Orders   CBC   Other Visit Diagnoses     Encounter for preventative adult health care exam with abnormal findings    -  Primary   Relevant Orders   Comprehensive metabolic panel   Lipid panel   Encounter for lipid screening for cardiovascular disease       Relevant Orders   Lipid panel   Encounter for routine history and physical exam in female       Relevant Orders   Ambulatory referral to Gynecology      Return in about 1 year (around 07/05/2023) for CPE (fasting).     Alysia Penna, NP

## 2022-07-05 NOTE — Assessment & Plan Note (Signed)
Repeat cbc

## 2022-08-15 NOTE — Therapy (Incomplete)
OUTPATIENT PHYSICAL THERAPY LOWER EXTREMITY EVALUATION   Patient Name: Teresa Myers MRN: [id number] DOB:11/21/1990, 32 y.o., female Today's Date: 08/16/2022  END OF SESSION:  PT End of Session - 08/16/22 1449     Visit Number 1    Number of Visits 17    Date for PT Re-Evaluation 10/11/22    Authorization Type [employer]    PT Start Time 1450   late check in   PT Stop Time 1528    PT Time Calculation (min) 38 min    Activity Tolerance Patient tolerated treatment well    Behavior During Therapy WFL for tasks assessed/performed             Past Medical History:  Diagnosis Date   Gestational diabetes    History of gestational hypertension    Hx of gonorrhea    Medical history non-contributory    Vaginal Pap smear, abnormal    HPV 2019   Past Surgical History:  Procedure Laterality Date   VAGINA SURGERY  2011   laser surgery   Patient Active Problem List   Diagnosis Date Noted   Chronic pain of left knee 07/05/2022   Tinea pedis of both feet 03/22/2021   Hemoglobin C trait (HCC) 02/18/2015    PCP: Anne Ng, NP  REFERRING PROVIDER: Anne Ng, NP  REFERRING DIAG: [phone] (ICD-10-CM) - Chronic pain of left knee  THERAPY DIAG:  Chronic pain of left knee - Plan: PT plan of care cert/re-cert  Localized edema - Plan: PT plan of care cert/re-cert  Muscle weakness (generalized) - Plan: PT plan of care cert/re-cert  Rationale for Evaluation and Treatment: Rehabilitation  ONSET DATE: 10-11 years  SUBJECTIVE:   SUBJECTIVE STATEMENT: Pt states she used to work at [employer] where she had to stand a lot, gradual onset of symptoms. Does not recall any injury or trauma to the area. Since then has fluctuated, gradually worsened over past few years. Tries to avoid standing when she can. If she stands for too long has a lot of swelling in knee. Has some numbness anteriorly when she feels more swollen. Otherwise no N/T, denies redness. Pt states  that several years ago she had a workup for it and thinks she was told that "a ligament was worn out" and "the bones were touching". She denies any provocative activities other than standing.  Pt accompanied to session by her son.   PERTINENT HISTORY: Unremarkable per chart review PAIN:  Are you having pain: none Location/description: L anterior knee, posterior knee Best-worst over past week: 0-7/10  - aggravating factors: standing ~32min - Easing factors: elevation, massage  PRECAUTIONS: None  WEIGHT BEARING RESTRICTIONS: No  FALLS:  Has patient fallen in last 6 months? 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, goes to gym  PLOF: Independent  PATIENT GOALS: be able to stand without as much pain, figure out plan to manage symptoms more conclusively  NEXT MD VISIT: next year   OBJECTIVE:   DIAGNOSTIC FINDINGS: no recent imaging in chart review, see subjective above for pt report of remote imaging  PATIENT SURVEYS:  FOTO deferred on eval given time constraints  COGNITION: Overall cognitive status: Within functional limits for tasks assessed     SENSATION: LT intact BIL LE throughout all dermatomes  EDEMA:  39.2cm LLE at joint line  37cm RLE  at joint line  MUSCLE LENGTH: Hamstrings both able to reach ~ 90 deg in supine  PALPATION/OBSERVATION: Tenderness about L quad tendon and distal quad; no tenderness along joint line, mild tenderness lateral to patellar tendon with observable pocket of swelling, no erythema  LOWER EXTREMITY ROM:     Active  Right eval Left eval  Hip flexion    Hip extension    Hip internal rotation    Hip external rotation    Knee extension 0 0  Knee flexion 128 128  (Blank rows = not tested) (Key: WFL = within functional limits not formally assessed, * = concordant pain, s = stiffness/stretching  sensation, NT = not tested)  Comments:  ROM painless BIL   LOWER EXTREMITY MMT:    MMT Right eval Left eval  Hip flexion    Hip abduction (modified sitting)    Hip internal rotation    Hip external rotation    Knee flexion 5 4 *  Knee extension 5 4 *  Ankle dorsiflexion     (Blank rows = not tested) (Key: WFL = within functional limits not formally assessed, * = concordant pain, s = stiffness/stretching sensation, NT = not tested)  Comments: Gradual increase in pain in moments after MMT that improves with time  LOWER EXTREMITY SPECIAL TESTS:  Mild increase in pain with LLE valgus testing, no laxity noted  Unremarkable varus testing, AP/PA tib-fem mobilizations (grade 2-3)  FUNCTIONAL TESTS:  Deferred given time constraints - based on eval would recommend BW squat vs 5xSTS   GAIT: Distance walked: within clinic Assistive device utilized: None Level of assistance: Complete Independence Comments: gait mechanics grossly WNL   TODAY'S TREATMENT:                                                                                                                              [facility] Adult PT Treatment:                                                DATE: 08/16/22 Therapeutic Exercise: Quad set x10 Seated heel/toe raises x10 HEP handout, education, rationale for interventions and relevant anatomy/physiology     PATIENT EDUCATION:  Education details: Pt education on PT impairments, prognosis, and POC. Informed consent. Rationale for interventions, safe/appropriate HEP performance Person educated: Patient Education method: Explanation, Demonstration, Tactile cues, Verbal cues, and Handouts Education comprehension: verbalized understanding, returned demonstration, verbal cues required, tactile cues required, and needs further education    HOME EXERCISE PROGRAM: Access Code: [email or web address] URL: [email or web address] Date: 08/16/2022 Prepared by: Fransisco Hertz  Exercises -  Supine Quad Set  - 2-3 x daily - 7 x weekly - 1 sets - 10 reps - Seated Heel Toe Raises  - 2-3 x daily - 7 x weekly - 1 sets - 10 reps  ASSESSMENT:  CLINICAL IMPRESSION: Pt is a pleasant 32 year old woman who arrives to PT evaluation on this date for chronic L knee pain. Pt denies any mechanism of injury, reports standing is her only provocative activity and that after about she tends to have increase in swelling with some anterior numbness in knee. On exam, concordant pain is reproduced by knee MMT, palpation of quad/patellar tendon. Symptoms are fairly irritable on exam but improve with rest and HEP performance. No adverse events, recommend skilled PT to address aforementioned deficits with aim of maximizing functional tolerance. Pt departs today's session in no acute distress, all voiced questions/concerns addressed appropriately from PT perspective.    OBJECTIVE IMPAIRMENTS: decreased activity tolerance, decreased endurance, decreased strength, increased edema, and pain.   ACTIVITY LIMITATIONS: standing  PARTICIPATION LIMITATIONS: meal prep, cleaning, laundry, and community activity  PERSONAL FACTORS: Time since onset of injury/illness/exacerbation are also affecting patient's functional outcome.   REHAB POTENTIAL: Good  CLINICAL DECISION MAKING: Stable/uncomplicated  EVALUATION COMPLEXITY: Low   GOALS: Goals reviewed with patient? No  SHORT TERM GOALS: Target date: 09/13/2022 Pt will demonstrate appropriate understanding and performance of initially prescribed HEP in order to facilitate improved independence with management of symptoms.  Baseline: HEP provided on eval Goal status: INITIAL   2. Pt will improve greater than MCID on FOTO  in order to demonstrate improved perception of function due to symptoms.  Baseline: TBD  Goal status: INITIAL    LONG TERM GOALS: Target date: 10/11/2022 Pt will meet predicted score on FOTO in order to demonstrate improved perception of  function due to symptoms.  Baseline: TBD Goal status: INITIAL  2.  Pt will demonstrate at least 4+/5 knee flex/ext MMT on LLE in order to demonstrate improved functional strength. Baseline: see MMT chart above Goal status: INITIAL  3. Pt will demonstrate appropriate performance of final prescribed HEP in order to facilitate improved self-management of symptoms post-discharge.   Baseline: initial HEP prescribed  Goal status: INITIAL    4.  Pt will improve 5xSTS time by at least MCID in order to demonstrate reduced fall risk and improved functional independence (MCID 5xSTS = 2.3 sec). Baseline: TBD Goal status: INITIAL   5. Pt will report at least 50% decrease in overall pain levels in past week in order to facilitate improved tolerance to basic ADLs/mobility.   Baseline: 0-7/10 over past week  Goal status: INITIAL    6. Pt will report/demonstrate ability to stand for up to 1 hr with less than 3 pt increase in pain in order to facilitate improved functional tolerance for daily activities/mobility.  Baseline: increased pain swelling ~76min  Goal status: INITIAL      PLAN:  PT FREQUENCY: 1-2x/week (2x/week initially, plan to taper as appropriate pending trajectory)  PT DURATION: 8 weeks   PLANNED INTERVENTIONS: Therapeutic exercises, Therapeutic activity, Neuromuscular re-education, Balance training, Gait training, Patient/Family education, Self Care, Joint mobilization, Joint manipulation, Stair training, Aquatic Therapy, Dry Needling, Electrical stimulation, Spinal manipulation, Spinal mobilization, Cryotherapy, Moist heat, Vasopneumatic device, Manual therapy, and Re-evaluation  PLAN FOR NEXT SESSION: Review/update HEP PRN. Work Agricultural engineer. Look at [facility] and functional measure (squat vs 5xSTS). Symptom modification strategies as indicated/appropriate.    Ashley Murrain PT, DPT 08/16/2022 4:56 PM   Addendum to note family present for session: Ashley Murrain PT,  DPT 08/16/2022 4:56 PM

## 2022-08-16 ENCOUNTER — Ambulatory Visit: Payer: 59 | Attending: Nurse Practitioner | Admitting: Physical Therapy

## 2022-08-16 ENCOUNTER — Other Ambulatory Visit: Payer: Self-pay

## 2022-08-16 ENCOUNTER — Encounter: Payer: Self-pay | Admitting: Physical Therapy

## 2022-08-16 DIAGNOSIS — M6281 Muscle weakness (generalized): Secondary | ICD-10-CM | POA: Insufficient documentation

## 2022-08-16 DIAGNOSIS — G8929 Other chronic pain: Secondary | ICD-10-CM | POA: Diagnosis present

## 2022-08-16 DIAGNOSIS — M25562 Pain in left knee: Secondary | ICD-10-CM | POA: Insufficient documentation

## 2022-08-16 DIAGNOSIS — R600 Localized edema: Secondary | ICD-10-CM | POA: Insufficient documentation

## 2022-08-31 ENCOUNTER — Ambulatory Visit: Payer: 59 | Attending: Nurse Practitioner | Admitting: Physical Therapy

## 2022-08-31 ENCOUNTER — Encounter: Payer: Self-pay | Admitting: Physical Therapy

## 2022-08-31 DIAGNOSIS — G8929 Other chronic pain: Secondary | ICD-10-CM | POA: Diagnosis present

## 2022-08-31 DIAGNOSIS — R600 Localized edema: Secondary | ICD-10-CM | POA: Diagnosis present

## 2022-08-31 DIAGNOSIS — M6281 Muscle weakness (generalized): Secondary | ICD-10-CM | POA: Insufficient documentation

## 2022-08-31 DIAGNOSIS — M25562 Pain in left knee: Secondary | ICD-10-CM | POA: Diagnosis present

## 2022-08-31 NOTE — Therapy (Signed)
OUTPATIENT PHYSICAL THERAPY LOWER EXTREMITY TREATMENT NOTE    Patient Name: Teresa Myers MRN: [id number] DOB:1990/10/23, 32 y.o., female Today's Date: 08/31/2022  END OF SESSION:  PT End of Session - 08/31/22 0804     Visit Number 2    Number of Visits 17    Date for PT Re-Evaluation 10/11/22    Authorization Type [employer]    PT Start Time 0803    PT Stop Time 0845    PT Time Calculation (min) 42 min             Past Medical History:  Diagnosis Date   Gestational diabetes    History of gestational hypertension    Hx of gonorrhea    Medical history non-contributory    Vaginal Pap smear, abnormal    HPV 2019   Past Surgical History:  Procedure Laterality Date   VAGINA SURGERY  2011   laser surgery   Patient Active Problem List   Diagnosis Date Noted   Chronic pain of left knee 07/05/2022   Tinea pedis of both feet 03/22/2021   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 Standing is the only thing that bothers the knee. I do not have to stand a lot. I can stand 1 hour and I did not have any pain.    EVAL: Pt states she used to work at [employer] where she had to stand a lot, gradual onset of symptoms. Does not recall any injury or trauma to the area. Since then has fluctuated, gradually worsened over past few years. Tries to avoid standing when she can. If she stands for too long has a lot of swelling in knee. Has some numbness anteriorly when she feels more swollen. Otherwise no N/T, denies redness. Pt states that several years ago she had a workup for it and thinks she was told that  "a ligament was worn out" and "the bones were touching". She denies any provocative activities other than standing.  Pt accompanied to session by her son.   PERTINENT HISTORY: Unremarkable per chart review PAIN:  Are you having pain: none Location/description: L anterior knee, posterior knee Best-worst over past week: 0-7/10  - aggravating factors: standing ~38min - Easing factors: elevation, massage  PRECAUTIONS: None  WEIGHT BEARING RESTRICTIONS: No  FALLS:  Has patient fallen in last 6 months? 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, goes to gym  PLOF: Independent  PATIENT GOALS: be able to stand without as much pain, figure out plan to manage symptoms more conclusively  NEXT MD VISIT: next year   OBJECTIVE:   DIAGNOSTIC FINDINGS: no recent imaging in chart review, see subjective above for pt report of remote imaging  PATIENT SURVEYS:  FOTO deferred on eval given time constraints 08/31/22: 78%  COGNITION: Overall cognitive status: Within functional limits for tasks assessed     SENSATION: LT intact BIL LE throughout all dermatomes  EDEMA:  39.2cm LLE at joint line  37cm RLE at joint line  MUSCLE LENGTH: Hamstrings both able to reach ~ 90 deg in supine  PALPATION/OBSERVATION: Tenderness about L quad tendon and distal quad; no tenderness along joint line, mild tenderness lateral to patellar tendon with observable pocket of swelling, no erythema  LOWER EXTREMITY ROM:     Active  Right eval Left eval  Hip flexion    Hip extension    Hip internal rotation    Hip external rotation    Knee extension 0 0  Knee flexion 128 128  (Blank rows = not tested) (Key: WFL = within functional limits not formally assessed, * = concordant pain, s = stiffness/stretching sensation, NT = not tested)  Comments:  ROM painless BIL   LOWER  EXTREMITY MMT:    MMT Right eval Left eval Left 08/31/22  Hip flexion     Hip abduction (modified sitting)     Hip internal rotation     Hip external rotation     Knee flexion 5 4 * 4+ min pain  Knee extension 5 4 * 5 no pain  Ankle dorsiflexion      (Blank rows = not tested) (Key: WFL = within functional limits not formally assessed, * = concordant pain, s = stiffness/stretching sensation, NT = not tested)  Comments: Gradual increase in pain in moments after MMT that improves with time  LOWER EXTREMITY SPECIAL TESTS:  Mild increase in pain with LLE valgus testing, no laxity noted  Unremarkable varus testing, AP/PA tib-fem mobilizations (grade 2-3)  FUNCTIONAL TESTS:  Deferred given time constraints - based on eval would recommend BW squat vs 5xSTS  08/31/22: 13.7 sec 5 x STS    GAIT: Distance walked: within clinic Assistive device utilized: None Level of assistance: Complete Independence Comments: gait mechanics grossly WNL   TODAY'S TREATMENT:                                                                                                                              [facility] Adult PT Treatment:                                                DATE: 08/31/22 Therapeutic Exercise: Left QS supine - pain reported under patella  Bridge x 12  SLR x 10 Supine heel slide x 10 Step up left x 10- increased pain  Manual Therapy: Patella mobs  Therapeutic Activity: STS x 5 :13.7 sec BW squat difficult to assess due to pt wearing a narrow skirt.  Modalities: Ice pack to left knee (used vaso , freezer defrosted)     [facility] Adult PT Treatment:  DATE: 08/16/22 Therapeutic Exercise: Quad set x10 Seated heel/toe raises x10 HEP handout, education, rationale for interventions and relevant anatomy/physiology     PATIENT EDUCATION:  Education details: Pt education on PT impairments, prognosis, and POC. Informed consent. Rationale for  interventions, safe/appropriate HEP performance Person educated: Patient Education method: Explanation, Demonstration, Tactile cues, Verbal cues, and Handouts Education comprehension: verbalized understanding, returned demonstration, verbal cues required, tactile cues required, and needs further education    HOME EXERCISE PROGRAM: Access Code: [email or web address] URL: [email or web address] Date: 08/16/2022 Prepared by: Fransisco Hertz  Exercises - Supine Quad Set  - 2-3 x daily - 7 x weekly - 1 sets - 10 reps - Seated Heel Toe Raises  - 2-3 x daily - 7 x weekly - 1 sets - 10 reps Added SLR and heel slides  ASSESSMENT:  CLINICAL IMPRESSION: Pt arrives reporting improvement in pain, able to stand 1 hour without pain, denies any other aggravating factors. Reports compliance with HEP.  Captured 5 x STS. Body weight squat limited due to patient 's restrictive clothing today. Step ups in clinic did increase pain in her right knee although she has not noticed pain with daily stairs. Reviewed HEP and she does have pain with supine QS as she has only been completing this in seated. Fully extended SLR was also uncomfortable. She describes pain as being under her patella. No tenderness to palpation of quad or patella tendon today and she demonstrates improved knee strength, mild pain on extension. Her HEP was updated and ice applied at end of session to decrease pain and educate on cryotherapy.   EVAL: Pt is a pleasant 32 year old woman who arrives to PT evaluation on this date for chronic L knee pain. Pt denies any mechanism of injury, reports standing is her only provocative activity and that after about she tends to have increase in swelling with some anterior numbness in knee. On exam, concordant pain is reproduced by knee MMT, palpation of quad/patellar tendon. Symptoms are fairly irritable on exam but improve with rest and HEP performance. No adverse events, recommend skilled PT to address  aforementioned deficits with aim of maximizing functional tolerance. Pt departs today's session in no acute distress, all voiced questions/concerns addressed appropriately from PT perspective.    OBJECTIVE IMPAIRMENTS: decreased activity tolerance, decreased endurance, decreased strength, increased edema, and pain.   ACTIVITY LIMITATIONS: standing  PARTICIPATION LIMITATIONS: meal prep, cleaning, laundry, and community activity  PERSONAL FACTORS: Time since onset of injury/illness/exacerbation are also affecting patient's functional outcome.   REHAB POTENTIAL: Good  CLINICAL DECISION MAKING: Stable/uncomplicated  EVALUATION COMPLEXITY: Low   GOALS: Goals reviewed with patient? No  SHORT TERM GOALS: Target date: 09/13/2022 Pt will demonstrate appropriate understanding and performance of initially prescribed HEP in order to facilitate improved independence with management of symptoms.  Baseline: HEP provided on eval 08/31/22: needs cues for exercise position (supine QS) Goal status: INITIAL   2. Pt will improve greater than MCID on FOTO  in order to demonstrate improved perception of function due to symptoms.  Baseline: TBD (78% baseline)  Goal status: INITIAL    LONG TERM GOALS: Target date: 10/11/2022 Pt will meet predicted score on FOTO in order to demonstrate improved perception of function due to symptoms.  Baseline: TBD (78% baseline) Goal status: INITIAL  2.  Pt will demonstrate at least 4+/5 knee flex/ext MMT on LLE in order to demonstrate improved functional strength. Baseline: see MMT chart above Goal status: INITIAL  3. Pt will demonstrate appropriate  performance of final prescribed HEP in order to facilitate improved self-management of symptoms post-discharge.   Baseline: initial HEP prescribed  Goal status: INITIAL    4.  Pt will improve 5xSTS time by at least MCID in order to demonstrate reduced fall risk and improved functional independence (MCID 5xSTS = 2.3  sec). Baseline: TBD (13.7 sec)  Goal status: INITIAL   5. Pt will report at least 50% decrease in overall pain levels in past week in order to facilitate improved tolerance to basic ADLs/mobility.   Baseline: 0-7/10 over past week  Goal status: INITIAL    6. Pt will report/demonstrate ability to stand for up to 1 hr with less than 3 pt increase in pain in order to facilitate improved functional tolerance for daily activities/mobility.  Baseline: increased pain swelling ~21min  Goal status: INITIAL      PLAN:  PT FREQUENCY: 1-2x/week (2x/week initially, plan to taper as appropriate pending trajectory)  PT DURATION: 8 weeks   PLANNED INTERVENTIONS: Therapeutic exercises, Therapeutic activity, Neuromuscular re-education, Balance training, Gait training, Patient/Family education, Self Care, Joint mobilization, Joint manipulation, Stair training, Aquatic Therapy, Dry Needling, Electrical stimulation, Spinal manipulation, Spinal mobilization, Cryotherapy, Moist heat, Vasopneumatic device, Manual therapy, and Re-evaluation  PLAN FOR NEXT SESSION: Review/update HEP PRN. Work Agricultural engineer. Look at [facility] and functional measure (squat vs 5xSTS). Symptom modification strategies as indicated/appropriate.   Jannette Spanner, PTA 08/31/22 1:24 PM Phone: [phone] Fax: [phone]

## 2022-09-01 ENCOUNTER — Ambulatory Visit: Payer: 59 | Admitting: Physical Therapy

## 2022-09-12 ENCOUNTER — Encounter: Payer: Self-pay | Admitting: Physical Therapy

## 2022-09-15 ENCOUNTER — Encounter: Payer: Self-pay | Admitting: Physical Therapy

## 2022-09-15 ENCOUNTER — Ambulatory Visit: Payer: 59 | Admitting: Physical Therapy

## 2022-09-15 DIAGNOSIS — M6281 Muscle weakness (generalized): Secondary | ICD-10-CM

## 2022-09-15 DIAGNOSIS — G8929 Other chronic pain: Secondary | ICD-10-CM

## 2022-09-15 DIAGNOSIS — M25562 Pain in left knee: Secondary | ICD-10-CM | POA: Diagnosis not present

## 2022-09-15 DIAGNOSIS — R600 Localized edema: Secondary | ICD-10-CM

## 2022-09-15 NOTE — Therapy (Addendum)
OUTPATIENT PHYSICAL THERAPY LOWER EXTREMITY TREATMENT NOTE + NO VISIT DISCHARGE SUMMARY (see below)    Patient Name: Teresa Myers MRN: [id number] DOB:[birth year], 32 y.o., female Today's Date: 09/15/2022  END OF SESSION:  PT End of Session - 09/15/22 0851     Visit Number 3    Number of Visits 17    Date for PT Re-Evaluation 10/11/22    Authorization Type [employer]    PT Start Time 0850    PT Stop Time 0930    PT Time Calculation (min) 40 min             Past Medical History:  Diagnosis Date   Gestational diabetes    History of gestational hypertension    Hx of gonorrhea    Medical history non-contributory    Vaginal Pap smear, abnormal    HPV 2019   Past Surgical History:  Procedure Laterality Date   VAGINA SURGERY  2011   laser surgery   Patient Active Problem List   Diagnosis Date Noted   Chronic pain of left knee 07/05/2022   Tinea pedis of both feet 03/22/2021   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 Pt reports some improvement, can stand a little longer before pain begins. Will have pain with stairs if knees are already hurting from standing.   EVAL: Pt states she used to work at [employer] where she had to stand a lot, gradual onset of symptoms. Does not recall any injury or trauma to the area. Since then has fluctuated, gradually worsened over past few years. Tries to avoid standing when she can. If she stands for too long has a lot of swelling in knee. Has some numbness anteriorly when she feels more swollen. Otherwise no N/T, denies redness. Pt states that several years  ago she had a workup for it and thinks she was told that "a ligament was worn out" and "the bones were touching". She denies any provocative activities other than standing.  Pt accompanied to session by her son.   PERTINENT HISTORY: Unremarkable per chart review PAIN:  Are you having pain: 2/10 always front of knee behind knee cap Location/description: L anterior knee Best-worst over past week: 0-7/10  - aggravating factors: standing ~15min - Easing factors: elevation, massage  PRECAUTIONS: None  WEIGHT BEARING RESTRICTIONS: No  FALLS:  Has patient fallen in last 6 months? 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, goes to gym  PLOF: Independent  PATIENT GOALS: be able to stand without as much pain, figure out plan to manage symptoms more conclusively  NEXT MD VISIT: next year   OBJECTIVE:   DIAGNOSTIC FINDINGS: no recent imaging in chart review, see subjective above for pt report of remote imaging  PATIENT SURVEYS:  FOTO deferred on eval given time constraints 08/31/22: 78%  COGNITION: Overall cognitive status: Within functional limits for tasks assessed     SENSATION: LT intact BIL LE throughout all dermatomes  EDEMA:  39.2cm LLE at joint line  37cm RLE at joint line  MUSCLE LENGTH: Hamstrings both able to reach ~ 90 deg in supine  PALPATION/OBSERVATION: Tenderness about L quad tendon and distal quad; no tenderness along joint line, mild tenderness lateral to patellar tendon with observable pocket of swelling, no erythema  LOWER EXTREMITY ROM:     Active  Right eval Left eval  Hip flexion    Hip extension    Hip internal rotation    Hip external rotation    Knee extension 0 0  Knee flexion 128 128  (Blank rows = not tested) (Key: WFL = within functional limits not formally assessed, * = concordant pain, s =  stiffness/stretching sensation, NT = not tested)  Comments:  ROM painless BIL   LOWER EXTREMITY MMT:    MMT Right eval Left eval Left 08/31/22  Hip flexion     Hip abduction (modified sitting)     Hip internal rotation     Hip external rotation     Knee flexion 5 4 * 4+ min pain  Knee extension 5 4 * 5 no pain  Ankle dorsiflexion      (Blank rows = not tested) (Key: WFL = within functional limits not formally assessed, * = concordant pain, s = stiffness/stretching sensation, NT = not tested)  Comments: Gradual increase in pain in moments after MMT that improves with time  LOWER EXTREMITY SPECIAL TESTS:  Mild increase in pain with LLE valgus testing, no laxity noted  Unremarkable varus testing, AP/PA tib-fem mobilizations (grade 2-3)  FUNCTIONAL TESTS:  Deferred given time constraints - based on eval would recommend BW squat vs 5xSTS  08/31/22: 13.7 sec 5 x STS    GAIT: Distance walked: within clinic Assistive device utilized: None Level of assistance: Complete Independence Comments: gait mechanics grossly WNL   TODAY'S TREATMENT:                                                                                                                              [facility] Adult PT Treatment:                                                DATE: 09/15/22 Therapeutic Exercise: BW squat , no weight shift noted x 10 Applied tracking tape to patella before completing the following therex SAQ with ball squeeze QS into towel SLR with intial QS Side hip abduction x 12 +HEP Side Clam x 12 +HEP Manual Therapy: TPR to Vastus lateralis x 3  Mcconnel tape to correct lateral tracking patella      [facility] Adult PT Treatment:  DATE: 08/31/22 Therapeutic Exercise: Left QS supine - pain reported under patella  Bridge x 12  SLR x 10 Supine heel slide x 10 Step up left x 10- increased pain  Manual Therapy: Patella mobs  Therapeutic Activity: STS x 5  :13.7 sec BW squat difficult to assess due to pt wearing a narrow skirt.  Modalities: Ice pack to left knee (used vaso , freezer defrosted)     [facility] Adult PT Treatment:                                                DATE: 08/16/22 Therapeutic Exercise: Quad set x10 Seated heel/toe raises x10 HEP handout, education, rationale for interventions and relevant anatomy/physiology     PATIENT EDUCATION:  Education details: Pt education on PT impairments, prognosis, and POC. Informed consent. Rationale for interventions, safe/appropriate HEP performance Person educated: Patient Education method: Explanation, Demonstration, Tactile cues, Verbal cues, and Handouts Education comprehension: verbalized understanding, returned demonstration, verbal cues required, tactile cues required, and needs further education    HOME EXERCISE PROGRAM: Access Code: [email or web address] URL: [email or web address] Date: 08/16/2022 Prepared by: Fransisco Hertz  Exercises - Supine Quad Set  - 2-3 x daily - 7 x weekly - 1 sets - 10 reps - Seated Heel Toe Raises  - 2-3 x daily - 7 x weekly - 1 sets - 10 reps Added SLR and heel slides Added  Hip abduction and clam in side lying   ASSESSMENT:  CLINICAL IMPRESSION: Treatment #1: Pt reports maybe a little improvement in standing. Is performing HEP intermittently. It's been 2 weeks since last appt. Endorses that pain continues to be behind her patella, no superficial tenderness. Was tender to palpation along Vastus lateralis and noted to have lateral tracking left patella. Performed TPR to Vastus lateralis and applied Mcconnel tape to correct lateral tracking. Continued with quad activation and education on patella tracking. Pt reported therex felt more comfortable with tape donned but did experience more knee discomfort as exercises were progressed. Able to add lateral hip strength to HEP today with good tolerance. Tape somewhat loose at end of session due to pt  having applied oil to her skin prior to session. She will come prepared form more tape next session.   Treatment #1: Pt arrives reporting improvement in pain, able to stand 1 hour without pain, denies any other aggravating factors. Reports compliance with HEP.  Captured 5 x STS. Body weight squat limited due to patient 's restrictive clothing today. Step ups in clinic did increase pain in her right knee although she has not noticed pain with daily stairs. Reviewed HEP and she does have pain with supine QS as she has only been completing this in seated. Fully extended SLR was also uncomfortable. She describes pain as being under her patella. No tenderness to palpation of quad or patella tendon today and she demonstrates improved knee strength, mild pain on extension. Her HEP was updated and ice applied at end of session to decrease pain and educate on cryotherapy.   EVAL: Pt is a pleasant 32 year old woman who arrives to PT evaluation on this date for chronic L knee pain. Pt denies any mechanism of injury, reports standing is her only provocative activity and that after about she tends to have increase in swelling with some anterior numbness in knee. On exam, concordant pain is  reproduced by knee MMT, palpation of quad/patellar tendon. Symptoms are fairly irritable on exam but improve with rest and HEP performance. No adverse events, recommend skilled PT to address aforementioned deficits with aim of maximizing functional tolerance. Pt departs today's session in no acute distress, all voiced questions/concerns addressed appropriately from PT perspective.    OBJECTIVE IMPAIRMENTS: decreased activity tolerance, decreased endurance, decreased strength, increased edema, and pain.   ACTIVITY LIMITATIONS: standing  PARTICIPATION LIMITATIONS: meal prep, cleaning, laundry, and community activity  PERSONAL FACTORS: Time since onset of injury/illness/exacerbation are also affecting patient's functional  outcome.   REHAB POTENTIAL: Good  CLINICAL DECISION MAKING: Stable/uncomplicated  EVALUATION COMPLEXITY: Low   GOALS: Goals reviewed with patient? No  SHORT TERM GOALS: Target date: 09/13/2022 Pt will demonstrate appropriate understanding and performance of initially prescribed HEP in order to facilitate improved independence with management of symptoms.  Baseline: HEP provided on eval 08/31/22: needs cues for exercise position (supine QS) Goal status: ONGOING  2. Pt will improve greater than MCID on FOTO  in order to demonstrate improved perception of function due to symptoms.  Baseline: TBD (78% baseline)  Goal status: INITIAL    LONG TERM GOALS: Target date: 10/11/2022 Pt will meet predicted score on FOTO in order to demonstrate improved perception of function due to symptoms.  Baseline: TBD (78% baseline) Goal status: INITIAL  2.  Pt will demonstrate at least 4+/5 knee flex/ext MMT on LLE in order to demonstrate improved functional strength. Baseline: see MMT chart above Goal status: INITIAL  3. Pt will demonstrate appropriate performance of final prescribed HEP in order to facilitate improved self-management of symptoms post-discharge.   Baseline: initial HEP prescribed  Goal status: INITIAL    4.  Pt will improve 5xSTS time by at least MCID in order to demonstrate reduced fall risk and improved functional independence (MCID 5xSTS = 2.3 sec). Baseline: TBD (13.7 sec)  Goal status: INITIAL   5. Pt will report at least 50% decrease in overall pain levels in past week in order to facilitate improved tolerance to basic ADLs/mobility.   Baseline: 0-7/10 over past week  Goal status: INITIAL    6. Pt will report/demonstrate ability to stand for up to 1 hr with less than 3 pt increase in pain in order to facilitate improved functional tolerance for daily activities/mobility.  Baseline: increased pain swelling ~89min  Goal status: INITIAL      PLAN:  PT FREQUENCY:  1-2x/week (2x/week initially, plan to taper as appropriate pending trajectory)  PT DURATION: 8 weeks   PLANNED INTERVENTIONS: Therapeutic exercises, Therapeutic activity, Neuromuscular re-education, Balance training, Gait training, Patient/Family education, Self Care, Joint mobilization, Joint manipulation, Stair training, Aquatic Therapy, Dry Needling, Electrical stimulation, Spinal manipulation, Spinal mobilization, Cryotherapy, Moist heat, Vasopneumatic device, Manual therapy, and Re-evaluation  PLAN FOR NEXT SESSION: Review/update HEP PRN. Work Agricultural engineer. Look at [facility] and functional measure (squat vs 5xSTS). Symptom modification strategies as indicated/appropriate.   Jannette Spanner, PTA 09/15/22 9:40 AM Phone: [phone] Fax: [phone]      Discharge addendum 12/01/2022  PHYSICAL THERAPY DISCHARGE SUMMARY  Visits from Start of Care: 3  Current functional level related to goals / functional outcomes: Unable to be assessed   Remaining deficits: Unable to be assessed   Education / Equipment: Unable to be assessed  Patient goals were unable to be assessed. Patient is being discharged due to not returning since the last visit.  Ashley Murrain PT, DPT 12/01/2022 11:31 AM

## 2022-09-22 ENCOUNTER — Ambulatory Visit: Payer: 59 | Admitting: Physical Therapy

## 2022-09-29 ENCOUNTER — Telehealth: Payer: Self-pay | Admitting: Physical Therapy

## 2022-09-29 ENCOUNTER — Ambulatory Visit: Payer: 59 | Attending: Nurse Practitioner | Admitting: Physical Therapy

## 2022-09-29 NOTE — Telephone Encounter (Signed)
Left voicemail regarding second late cancel/ no show. Left reminder of next appointment  9/27 as well as reminder of attendance policy. She will be able to schedule 1 appointment at a time going forward.

## 2022-10-13 ENCOUNTER — Ambulatory Visit: Payer: 59 | Admitting: Physical Therapy

## 2022-12-23 ENCOUNTER — Emergency Department (HOSPITAL_BASED_OUTPATIENT_CLINIC_OR_DEPARTMENT_OTHER)
Admission: EM | Admit: 2022-12-23 | Discharge: 2022-12-23 | Disposition: A | Discharge status: Home / Self Care | Payer: 59 | Attending: Emergency Medicine | Admitting: Emergency Medicine

## 2022-12-23 ENCOUNTER — Other Ambulatory Visit: Payer: Self-pay

## 2022-12-23 ENCOUNTER — Encounter (HOSPITAL_BASED_OUTPATIENT_CLINIC_OR_DEPARTMENT_OTHER): Payer: Self-pay | Admitting: Emergency Medicine

## 2022-12-23 ENCOUNTER — Emergency Department (HOSPITAL_BASED_OUTPATIENT_CLINIC_OR_DEPARTMENT_OTHER): Payer: 59

## 2022-12-23 DIAGNOSIS — W1839XA Other fall on same level, initial encounter: Secondary | ICD-10-CM | POA: Insufficient documentation

## 2022-12-23 DIAGNOSIS — S0990XA Unspecified injury of head, initial encounter: Secondary | ICD-10-CM

## 2022-12-23 MED ORDER — KETOROLAC TROMETHAMINE 15 MG/ML IJ SOLN
15.0000 mg | Freq: Once | INTRAMUSCULAR | Status: AC
  Administered 2022-12-23: 15 mg via INTRAVENOUS
  Filled 2022-12-23: qty 1

## 2022-12-23 NOTE — ED Triage Notes (Signed)
Pt via pov from home with after a fall at a skating rink. Pt able to understand speech, unable to answer questions. Pt accompanied by husband and 2 children. Squeezes hands but unable to wiggle toes; able to lift both legs slightly. Pt alert.

## 2022-12-23 NOTE — ED Provider Triage Note (Signed)
Emergency Medicine Provider Triage Evaluation Note  Teresa Myers , a 32 y.o. female  was evaluated in triage.  Pt complains of Head injury.  Review of Systems  Positive: Head pain Negative: Loss of consciousness, midline tenderness, neck pain, weakness, numbness  Physical Exam  BP 118/78 (BP Location: Right Arm)   Pulse 86   Temp 99.3 F (37.4 C) (Oral)   Resp 17   Ht 5\' 4"  (1.626 m)   Wt 59.5 kg   SpO2 100%   BMI 22.52 kg/m  Gen:   Awake, no distress   Resp:  Normal effort  MSK:   Moves extremities without difficulty  Other:    Medical Decision Making  Medically screening exam initiated at 6:15 PM.  Appropriate orders placed.  Edgardo Roys was informed that the remainder of the evaluation will be completed by another provider, this initial triage assessment does not replace that evaluation, and the importance of remaining in the ED until their evaluation is complete.  CT head Noncon and CT C-spine Noncon ordered   Dolphus Jenny, PA-C 12/23/22 1610

## 2022-12-23 NOTE — ED Notes (Signed)
Pt walked about 25 ft with steady gait. No complaints of nausea or dizziness.

## 2022-12-23 NOTE — Discharge Instructions (Signed)
Tylenol or Motrin as needed for pain  Follow-up outpatient  Return for new or worsening symptoms such as severe headache, confusion, vomiting 3 or more times in 24 hours

## 2022-12-23 NOTE — ED Provider Notes (Signed)
[place] EMERGENCY DEPARTMENT AT [facility] Provider Note   CSN: [id number] Arrival date & time: 12/23/22  1719    History  Chief Complaint  Patient presents with   Head Injury    Teresa Myers is a 32 y.o. female here for evaluation of head injury.  Patient was ice-skating earlier today subsequently fell backwards only on posterior aspect of her head.  Initially was fine however states she was slightly dizzy.  On the drive home patient began staring off into space.  When she arrived here she was only answering yes or no questions.  She was fine prior to the fall.  She shakes her head yes when asked if she has a headache.  Shakes her head no with other pain.  HPI     Home Medications Prior to Admission medications   Medication Sig Start Date End Date Taking? Authorizing Provider  fluconazole (DIFLUCAN) 150 MG tablet Take 1 tablet (150 mg total) by mouth daily. Take second tab 3days apart from first tab Patient not taking: Reported on 07/05/2022 03/22/22   Nche, Bonna Gains, NP  fluticasone (FLONASE) 50 MCG/ACT nasal spray Place 2 sprays into both nostrils daily. 02/11/21   Theadora Rama Scales, PA-C  metroNIDAZOLE (METROGEL) 0.75 % vaginal gel Place 1 Applicatorful vaginally at bedtime. 03/22/22   Nche, Bonna Gains, NP  tolnaftate (TINACTIN) 1 % powder Apply 1 Application topically 2 (two) times daily. 03/17/22   Nche, Bonna Gains, NP      Allergies    Other and Pineapple    Review of Systems   Review of Systems  Constitutional: Negative.   HENT: Negative.    Respiratory: Negative.    Cardiovascular: Negative.   Gastrointestinal: Negative.   Genitourinary: Negative.   Musculoskeletal: Negative.   Skin: Negative.   Neurological:  Positive for headaches. Negative for dizziness, tremors, seizures, syncope, facial asymmetry, speech difficulty, weakness, light-headedness and numbness.  Psychiatric/Behavioral:  Positive for confusion.   All other systems reviewed  and are negative.   Physical Exam Updated Vital Signs BP 123/85   Pulse 84   Temp 99.3 F (37.4 C) (Oral)   Resp (!) 22   Ht 5\' 4"  (1.626 m)   Wt 59.5 kg   SpO2 100%   BMI 22.52 kg/m  Physical Exam Vitals and nursing note reviewed.  Constitutional:      General: She is not in acute distress.    Appearance: She is well-developed. She is not ill-appearing, toxic-appearing or diaphoretic.  HENT:     Head: Normocephalic and atraumatic.     Comments: No raccoon eye, Battle sign    Nose: Nose normal.     Mouth/Throat:     Mouth: Mucous membranes are moist.  Eyes:     Pupils: Pupils are equal, round, and reactive to light.  Neck:     Comments: Moves neck without difficulty Cardiovascular:     Rate and Rhythm: Normal rate.     Pulses: Normal pulses.     Heart sounds: Normal heart sounds.  Pulmonary:     Effort: Pulmonary effort is normal. No respiratory distress.     Breath sounds: Normal breath sounds.  Abdominal:     General: Bowel sounds are normal. There is no distension.     Palpations: Abdomen is soft.     Tenderness: There is no abdominal tenderness. There is no guarding or rebound.  Musculoskeletal:        General: Normal range of motion.  Cervical back: Normal range of motion and neck supple.     Comments: Moves extremities, ambulatory  Skin:    General: Skin is warm and dry.  Neurological:     General: No focal deficit present.     Mental Status: She is alert.     Cranial Nerves: Cranial nerves 2-12 are intact.     Sensory: Sensation is intact.     Motor: Motor function is intact.     Gait: Gait is intact.  Psychiatric:        Mood and Affect: Mood normal.    ED Results / Procedures / Treatments   Labs (all labs ordered are listed, but only abnormal results are displayed) Labs Reviewed - No data to display  EKG EKG Interpretation Date/Time:  Saturday December 23 2022 18:15:33 EST Ventricular Rate:  87 PR Interval:  188 QRS Duration:  76 QT  Interval:  359 QTC Calculation: 432 R Axis:   89  Text Interpretation: Sinus rhythm Confirmed by Virgina Norfolk [phone]) on 12/23/2022 6:59:00 PM  Radiology CT Head Wo Contrast  Result Date: 12/23/2022 CLINICAL DATA:  Head trauma, abnormal mental status (Age 69-64y); Neck trauma, focal neuro deficit or paresthesia (Age 68-64y) EXAM: CT HEAD WITHOUT CONTRAST CT CERVICAL SPINE WITHOUT CONTRAST TECHNIQUE: Multidetector CT imaging of the head and cervical spine was performed following the standard protocol without intravenous contrast. Multiplanar CT image reconstructions of the cervical spine were also generated. RADIATION DOSE REDUCTION: This exam was performed according to the departmental dose-optimization program which includes automated exposure control, adjustment of the mA and/or kV according to patient size and/or use of iterative reconstruction technique. COMPARISON:  None Available. FINDINGS: CT HEAD FINDINGS Brain: No hemorrhage. No hydrocephalus. No extra-axial fluid collection. No CT evidence of an acute cortical infarct. No mass effect. No mass lesion. Vascular: No hyperdense vessel or unexpected calcification. Skull: Normal. Negative for fracture or focal lesion. Sinuses/Orbits: No middle ear or mastoid effusion. Paranasal sinuses are clear. Orbits are unremarkable. Other: None. CT CERVICAL SPINE FINDINGS Alignment: Normal. Skull base and vertebrae: No acute fracture. No primary bone lesion or focal pathologic process. Soft tissues and spinal canal: No prevertebral fluid or swelling. No visible canal hematoma. Disc levels:  No CT evidence of high-grade spinal canal stenosis. Upper chest: Negative. Other: None IMPRESSION: 1. No CT evidence of intracranial injury. 2. No acute fracture or traumatic malalignment of the cervical spine. Electronically Signed   By: Lorenza Cambridge M.D.   On: 12/23/2022 19:12   CT Cervical Spine Wo Contrast  Result Date: 12/23/2022 CLINICAL DATA:  Head trauma, abnormal  mental status (Age 15-64y); Neck trauma, focal neuro deficit or paresthesia (Age 62-64y) EXAM: CT HEAD WITHOUT CONTRAST CT CERVICAL SPINE WITHOUT CONTRAST TECHNIQUE: Multidetector CT imaging of the head and cervical spine was performed following the standard protocol without intravenous contrast. Multiplanar CT image reconstructions of the cervical spine were also generated. RADIATION DOSE REDUCTION: This exam was performed according to the departmental dose-optimization program which includes automated exposure control, adjustment of the mA and/or kV according to patient size and/or use of iterative reconstruction technique. COMPARISON:  None Available. FINDINGS: CT HEAD FINDINGS Brain: No hemorrhage. No hydrocephalus. No extra-axial fluid collection. No CT evidence of an acute cortical infarct. No mass effect. No mass lesion. Vascular: No hyperdense vessel or unexpected calcification. Skull: Normal. Negative for fracture or focal lesion. Sinuses/Orbits: No middle ear or mastoid effusion. Paranasal sinuses are clear. Orbits are unremarkable. Other: None. CT CERVICAL SPINE FINDINGS Alignment:  Normal. Skull base and vertebrae: No acute fracture. No primary bone lesion or focal pathologic process. Soft tissues and spinal canal: No prevertebral fluid or swelling. No visible canal hematoma. Disc levels:  No CT evidence of high-grade spinal canal stenosis. Upper chest: Negative. Other: None IMPRESSION: 1. No CT evidence of intracranial injury. 2. No acute fracture or traumatic malalignment of the cervical spine. Electronically Signed   By: Lorenza Cambridge M.D.   On: 12/23/2022 19:12    Procedures Procedures    Medications Ordered in ED Medications  ketorolac (TORADOL) 15 MG/ML injection 15 mg (15 mg Intravenous Given 12/23/22 1930)    ED Course/ Medical Decision Making/ A&P   32 year old here for evaluation of head injury.  On arrival patient able to follow commands however only answering yes to questions.   Moves all 4 extremities.  She was well up until her fall.  She is not on any known coagulation.  She has no obvious facial droop, equal strength bilaterally plan on CT head and neck and reassess  Imaging personally viewed and interpreted:  CT head, cervical without significant abnormality  Patient reassessed.  Back to baseline.  Answering questions appropriately.  Neurovascular intact.  Ambulatory, tolerating p.o. intake.  Discussed results with patient, significant other in room.  Will have him keep close eye on her, return for any worsening symptoms  The patient has been appropriately medically screened and/or stabilized in the ED. I have low suspicion for any other emergent medical condition which would require further screening, evaluation or treatment in the ED or require inpatient management.  Patient is hemodynamically stable and in no acute distress.  Patient able to ambulate in department prior to ED.  Evaluation does not show acute pathology that would require ongoing or additional emergent interventions while in the emergency department or further inpatient treatment.  I have discussed the diagnosis with the patient and answered all questions.  Pain is been managed while in the emergency department and patient has no further complaints prior to discharge.  Patient is comfortable with plan discussed in room and is stable for discharge at this time.  I have discussed strict return precautions for returning to the emergency department.  Patient was encouraged to follow-up with PCP/specialist refer to at discharge.                                Medical Decision Making Amount and/or Complexity of Data Reviewed Independent Historian: spouse External Data Reviewed: labs, radiology and notes. Radiology: ordered and independent interpretation performed. Decision-making details documented in ED Course.  Risk OTC drugs. Prescription drug management. Parenteral controlled  substances. Decision regarding hospitalization. Diagnosis or treatment significantly limited by social determinants of health.         Final Clinical Impression(s) / ED Diagnoses Final diagnoses:  Injury of head, initial encounter    Rx / DC Orders ED Discharge Orders     None         Kron Everton A, PA-C 12/23/22 2115    Virgina Norfolk, DO 12/23/22 2136

## 2022-12-25 ENCOUNTER — Telehealth: Payer: Self-pay

## 2022-12-25 NOTE — Transitions of Care (Post Inpatient/ED Visit) (Unsigned)
   12/25/2022  Name: Teresa Myers MRN: [id number] DOB: 01-Feb-1990  Today's TOC FU Call Status: Today's TOC FU Call Status:: Unsuccessful Call (1st Attempt) Unsuccessful Call (1st Attempt) Date: 12/25/22  Attempted to reach the patient regarding the most recent Inpatient/ED visit.  Follow Up Plan: Additional outreach attempts will be made to reach the patient to complete the Transitions of Care (Post Inpatient/ED visit) call.   Signature Arvil Persons, BSN, Charity fundraiser

## 2022-12-27 NOTE — Transitions of Care (Post Inpatient/ED Visit) (Signed)
   12/27/2022  Name: Teresa Myers MRN: [id number] DOB: 04-Jul-1990  Today's TOC FU Call Status: Today's TOC FU Call Status:: Successful TOC FU Call Completed Unsuccessful Call (1st Attempt) Date: 12/25/22 [facility] FU Call Complete Date: 12/27/22 Patient's Name and Date of Birth confirmed.  Transition Care Management Follow-up Telephone Call Date of Discharge: 12/23/22 Discharge Facility: [facility] ([facility]) Type of Discharge: Emergency Department How have you been since you were released from the hospital?: Better  Items Reviewed: Did you receive and understand the discharge instructions provided?: Yes Any new allergies since your discharge?: No Dietary orders reviewed?: NA Do you have support at home?: Yes  Medications Reviewed Today: Medications Reviewed Today     Reviewed by Larey Dresser, RN (Registered Nurse) on 12/27/22 at 1128  Med List Status: <None>   Medication Order Taking? Sig Documenting Provider Last Dose Status Informant  fluconazole (DIFLUCAN) 150 MG tablet [id number]  Take 1 tablet (150 mg total) by mouth daily. Take second tab 3days apart from first tab  Patient not taking: Reported on 07/05/2022   Anne Ng, NP  Active   fluticasone (FLONASE) 50 MCG/ACT nasal spray [id number] Yes Place 2 sprays into both nostrils daily. Theadora Rama Scales, PA-C Taking Active   metroNIDAZOLE (METROGEL) 0.75 % vaginal gel [id number]  Place 1 Applicatorful vaginally at bedtime. Nche, Bonna Gains, NP  Active            Med Note Toy Care, JESSICA M   Wed Jul 05, 2022  8:50 AM) As needed  tolnaftate (TINACTIN) 1 % powder [id number]  Apply 1 Application topically 2 (two) times daily. Nche, Bonna Gains, NP  Active            Med Note Toy Care, JESSICA M   Wed Jul 05, 2022  8:50 AM) As needed            Home Care and Equipment/Supplies: Were Home Health Services Ordered?: NA Any new equipment or medical supplies ordered?: NA  Functional  Questionnaire: Do you need assistance with bathing/showering or dressing?: No Do you need assistance with meal preparation?: No Do you need assistance with eating?: No Do you have difficulty maintaining continence: No Do you need assistance with getting out of bed/getting out of a chair/moving?: No Do you have difficulty managing or taking your medications?: No  Follow up appointments reviewed: PCP Follow-up appointment confirmed?: No Specialist Hospital Follow-up appointment confirmed?: No Do you need transportation to your follow-up appointment?: No Do you understand care options if your condition(s) worsen?: Yes-patient verbalized understanding    SIGNATURE Arvil Persons, BSN, RN

## 2023-04-13 IMAGING — MG DIGITAL DIAGNOSTIC BILAT W/ TOMO W/ CAD
8 of 14 series · 8 of 40 positions shown · non-contrast
Comparison: Previous exam(s).

CLINICAL DATA: Patient presents for palpable abnormality within the
upper inner left breast.

EXAM:
DIGITAL DIAGNOSTIC BILATERAL MAMMOGRAM WITH TOMOSYNTHESIS AND CAD;
ULTRASOUND LEFT BREAST LIMITED
TECHNIQUE: Bilateral digital diagnostic mammography and breast tomosynthesis
was performed. The images were evaluated with computer-aided
detection.; Targeted ultrasound examination of the left breast was
performed.

[R CC synth-2D]
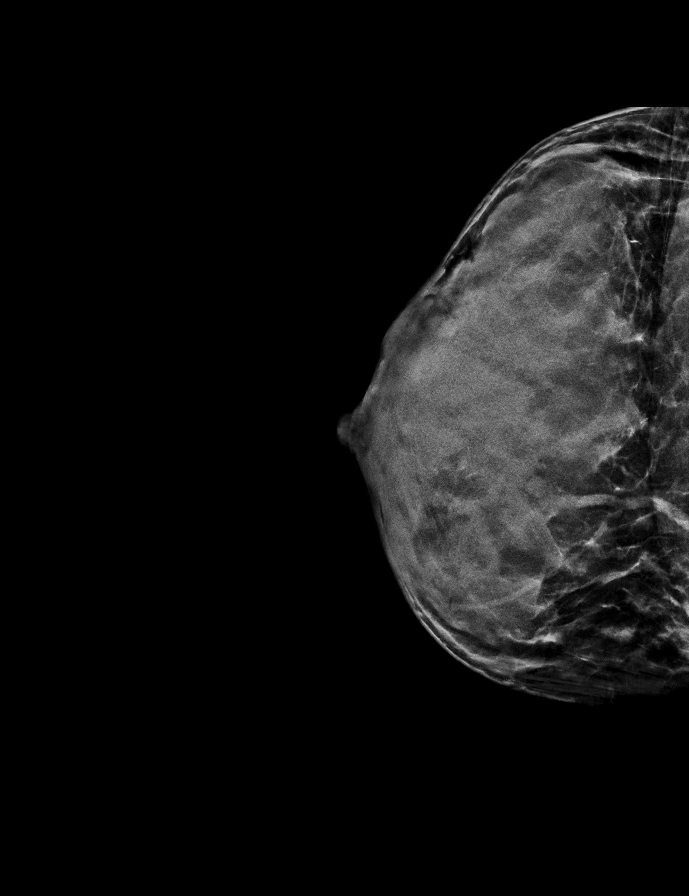

[L ML synth-2D (1 of 2)]
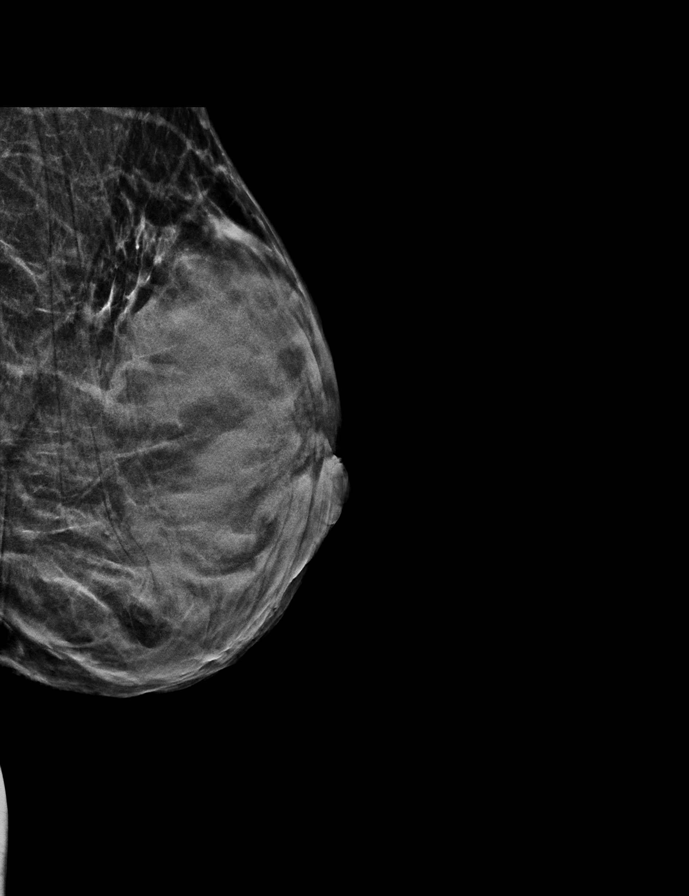

[L CC synth-2D]
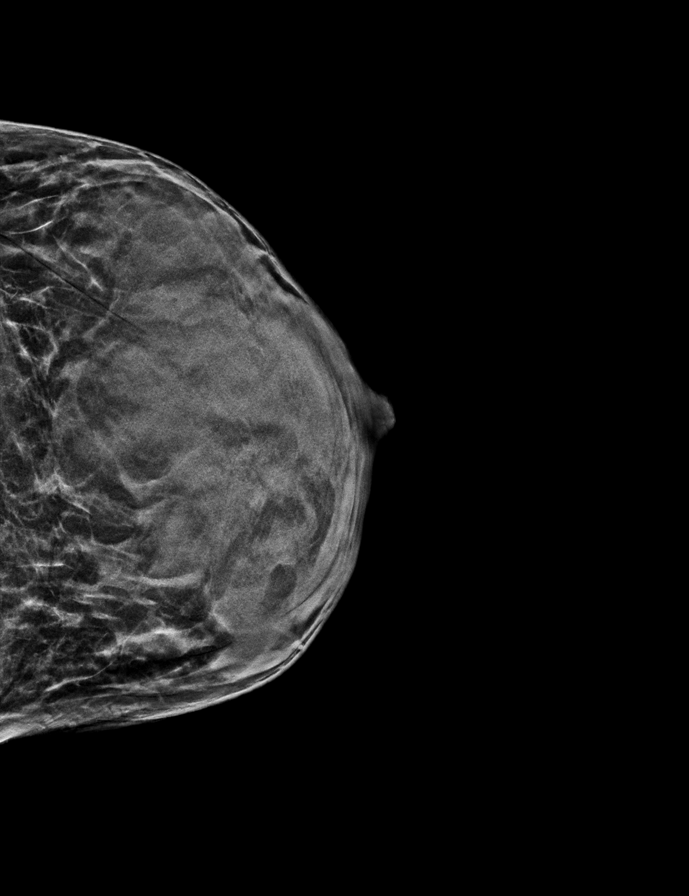

[R MLO synth-2D]
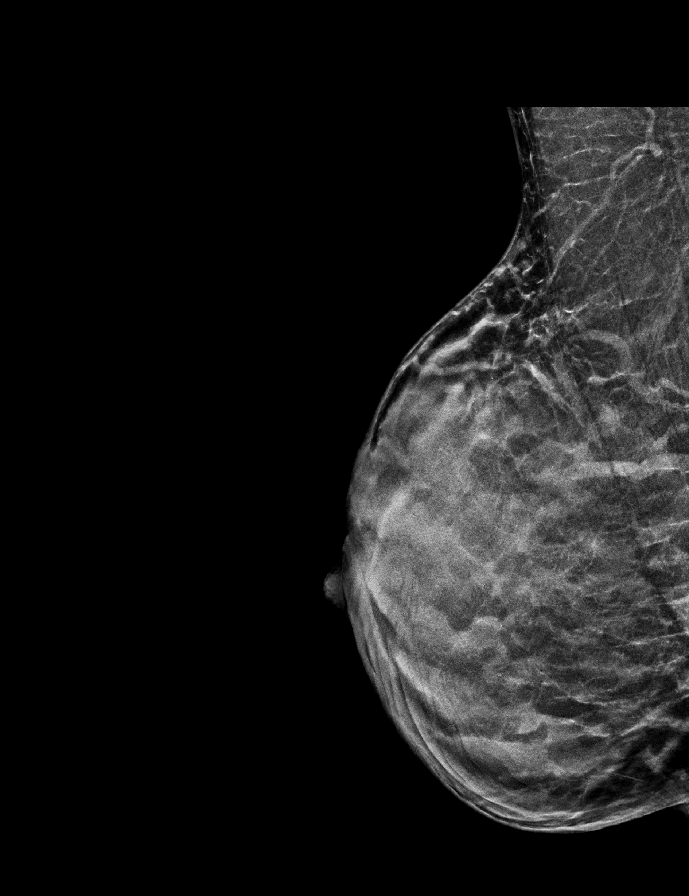

[L MLO synth-2D (1 of 2)]
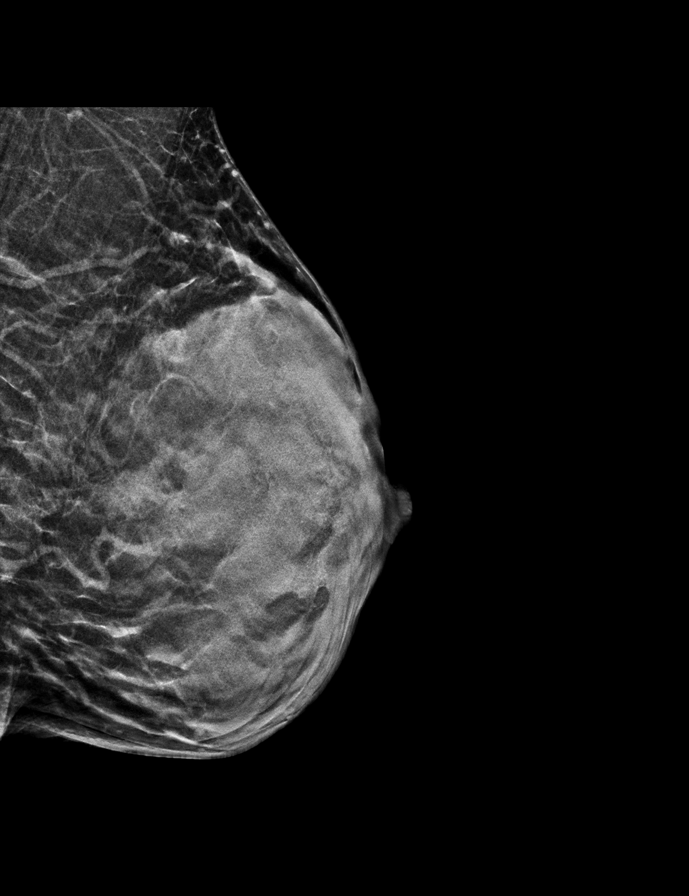

[L MLO synth-2D (2 of 2)]
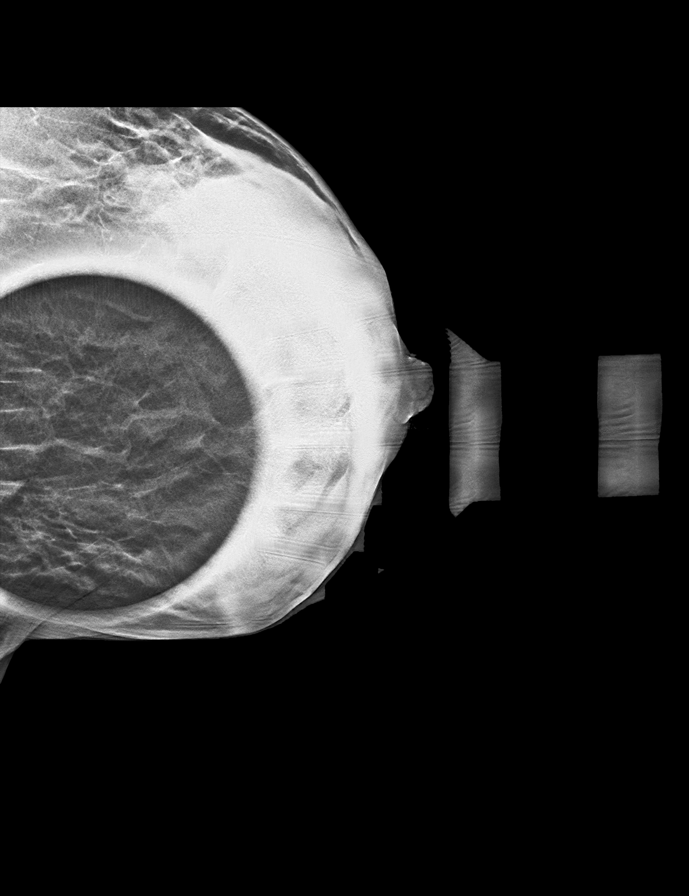

[L ML synth-2D (2 of 2)]
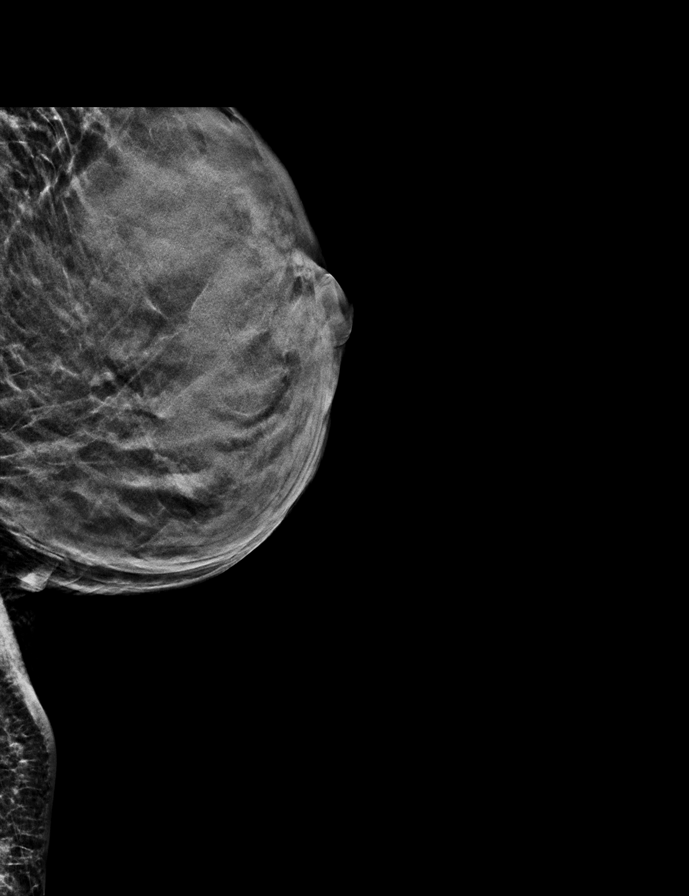

[L ML tomo · tomo slice 21/41.0]
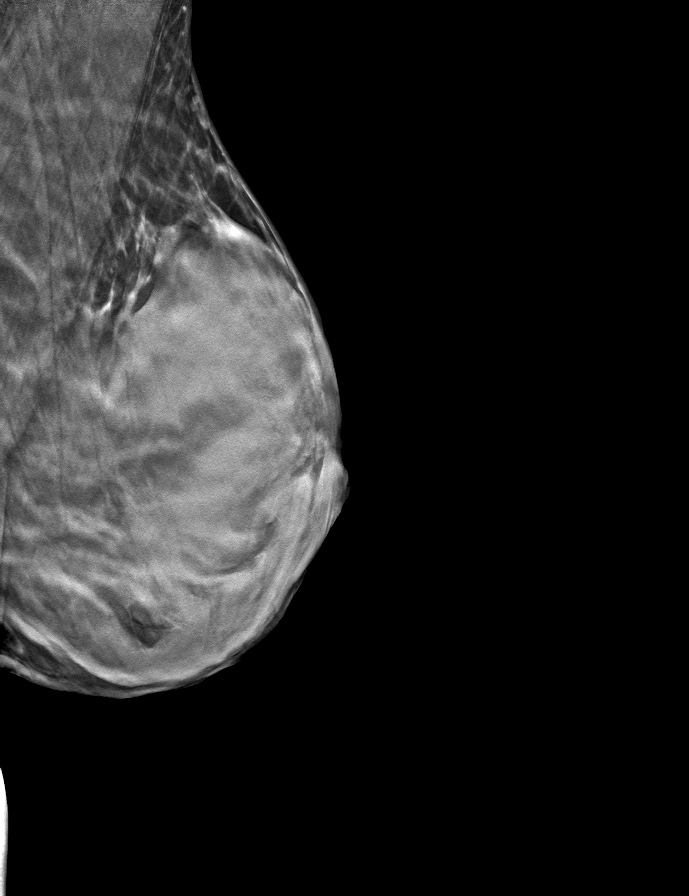

[8 of 40 positions shown; findings below may reference images not displayed]

ACR Breast Density Category d: The breast tissue is extremely dense,
which lowers the sensitivity of mammography.
FINDINGS: No concerning masses, calcifications or distortion identified within
either breast.

On physical exam, dense tissue is palpated upper inner left breast.

Targeted ultrasound is performed, showing normal dense tissue
without suspicious mass at the site of palpable concern left breast
10-11 o'clock position.
IMPRESSION: No mammographic evidence for malignancy. No suspicious abnormality
the site of palpable concern upper inner left breast.

RECOMMENDATION:
Screening mammogram at age 39 unless there are persistent or
intervening clinical concerns. (Code:[id number]).

Continued clinical evaluation for palpable mass left breast.

I have discussed the findings and recommendations with the patient.
If applicable, a reminder letter will be sent to the patient
regarding the next appointment.

BI-RADS CATEGORY  2: Benign.

## 2023-07-09 ENCOUNTER — Encounter: Payer: 59 | Admitting: Nurse Practitioner
